# Patient Record
Sex: Female | Born: 1970 | Race: White | Hispanic: No | Marital: Single | Smoking: Current every day smoker
Health system: Southern US, Community
[De-identification: ages and names within clinical notes are randomized; demographics above are authoritative.]

## PROBLEM LIST (undated history)

## (undated) DIAGNOSIS — K7031 Alcoholic cirrhosis of liver with ascites: Secondary | ICD-10-CM

## (undated) DIAGNOSIS — F1010 Alcohol abuse, uncomplicated: Secondary | ICD-10-CM

## (undated) DIAGNOSIS — I8511 Secondary esophageal varices with bleeding: Secondary | ICD-10-CM

## (undated) DIAGNOSIS — I10 Essential (primary) hypertension: Secondary | ICD-10-CM

## (undated) DIAGNOSIS — K861 Other chronic pancreatitis: Secondary | ICD-10-CM

---

## 2008-09-03 ENCOUNTER — Emergency Department (HOSPITAL_COMMUNITY): Admission: EM | Admit: 2008-09-03 | Discharge: 2008-09-04 | Payer: Self-pay | Admitting: Emergency Medicine

## 2008-09-15 ENCOUNTER — Emergency Department (HOSPITAL_COMMUNITY): Admission: EM | Admit: 2008-09-15 | Discharge: 2008-09-16 | Payer: Self-pay | Admitting: Emergency Medicine

## 2008-09-24 ENCOUNTER — Emergency Department (HOSPITAL_COMMUNITY): Admission: EM | Admit: 2008-09-24 | Discharge: 2008-09-24 | Payer: Self-pay | Admitting: Emergency Medicine

## 2017-02-15 ENCOUNTER — Encounter (HOSPITAL_COMMUNITY): Payer: Self-pay | Admitting: Internal Medicine

## 2017-02-15 ENCOUNTER — Emergency Department (HOSPITAL_COMMUNITY): Payer: Medicaid Other

## 2017-02-15 ENCOUNTER — Other Ambulatory Visit: Payer: Self-pay

## 2017-02-15 ENCOUNTER — Inpatient Hospital Stay (HOSPITAL_COMMUNITY): Payer: Medicaid Other

## 2017-02-15 ENCOUNTER — Inpatient Hospital Stay (HOSPITAL_COMMUNITY)
Admission: EM | Admit: 2017-02-15 | Discharge: 2017-02-26 | DRG: 981 | Disposition: A | Discharge status: Hospice | Payer: Medicaid Other | Attending: Family Medicine | Admitting: Family Medicine

## 2017-02-15 DIAGNOSIS — K3189 Other diseases of stomach and duodenum: Secondary | ICD-10-CM | POA: Diagnosis present

## 2017-02-15 DIAGNOSIS — S2231XA Fracture of one rib, right side, initial encounter for closed fracture: Secondary | ICD-10-CM | POA: Diagnosis present

## 2017-02-15 DIAGNOSIS — K7040 Alcoholic hepatic failure without coma: Secondary | ICD-10-CM | POA: Diagnosis present

## 2017-02-15 DIAGNOSIS — F329 Major depressive disorder, single episode, unspecified: Secondary | ICD-10-CM | POA: Diagnosis present

## 2017-02-15 DIAGNOSIS — F1010 Alcohol abuse, uncomplicated: Secondary | ICD-10-CM | POA: Diagnosis present

## 2017-02-15 DIAGNOSIS — R791 Abnormal coagulation profile: Secondary | ICD-10-CM | POA: Diagnosis present

## 2017-02-15 DIAGNOSIS — D638 Anemia in other chronic diseases classified elsewhere: Secondary | ICD-10-CM

## 2017-02-15 DIAGNOSIS — N183 Chronic kidney disease, stage 3 (moderate): Secondary | ICD-10-CM | POA: Diagnosis present

## 2017-02-15 DIAGNOSIS — E8809 Other disorders of plasma-protein metabolism, not elsewhere classified: Secondary | ICD-10-CM

## 2017-02-15 DIAGNOSIS — Z09 Encounter for follow-up examination after completed treatment for conditions other than malignant neoplasm: Secondary | ICD-10-CM

## 2017-02-15 DIAGNOSIS — S72001A Fracture of unspecified part of neck of right femur, initial encounter for closed fracture: Secondary | ICD-10-CM

## 2017-02-15 DIAGNOSIS — K7031 Alcoholic cirrhosis of liver with ascites: Principal | ICD-10-CM

## 2017-02-15 DIAGNOSIS — W19XXXA Unspecified fall, initial encounter: Secondary | ICD-10-CM

## 2017-02-15 DIAGNOSIS — R42 Dizziness and giddiness: Secondary | ICD-10-CM

## 2017-02-15 DIAGNOSIS — Z79899 Other long term (current) drug therapy: Secondary | ICD-10-CM

## 2017-02-15 DIAGNOSIS — I10 Essential (primary) hypertension: Secondary | ICD-10-CM

## 2017-02-15 DIAGNOSIS — I959 Hypotension, unspecified: Secondary | ICD-10-CM | POA: Diagnosis present

## 2017-02-15 DIAGNOSIS — Z638 Other specified problems related to primary support group: Secondary | ICD-10-CM

## 2017-02-15 DIAGNOSIS — E876 Hypokalemia: Secondary | ICD-10-CM | POA: Diagnosis present

## 2017-02-15 DIAGNOSIS — W06XXXA Fall from bed, initial encounter: Secondary | ICD-10-CM | POA: Diagnosis present

## 2017-02-15 DIAGNOSIS — K7011 Alcoholic hepatitis with ascites: Secondary | ICD-10-CM | POA: Diagnosis present

## 2017-02-15 DIAGNOSIS — I129 Hypertensive chronic kidney disease with stage 1 through stage 4 chronic kidney disease, or unspecified chronic kidney disease: Secondary | ICD-10-CM | POA: Diagnosis present

## 2017-02-15 DIAGNOSIS — Z66 Do not resuscitate: Secondary | ICD-10-CM | POA: Diagnosis not present

## 2017-02-15 DIAGNOSIS — Z8711 Personal history of peptic ulcer disease: Secondary | ICD-10-CM

## 2017-02-15 DIAGNOSIS — R197 Diarrhea, unspecified: Secondary | ICD-10-CM

## 2017-02-15 DIAGNOSIS — F17200 Nicotine dependence, unspecified, uncomplicated: Secondary | ICD-10-CM | POA: Diagnosis present

## 2017-02-15 DIAGNOSIS — E871 Hypo-osmolality and hyponatremia: Secondary | ICD-10-CM

## 2017-02-15 DIAGNOSIS — Z7189 Other specified counseling: Secondary | ICD-10-CM

## 2017-02-15 DIAGNOSIS — R55 Syncope and collapse: Secondary | ICD-10-CM | POA: Diagnosis present

## 2017-02-15 DIAGNOSIS — D689 Coagulation defect, unspecified: Secondary | ICD-10-CM

## 2017-02-15 DIAGNOSIS — N179 Acute kidney failure, unspecified: Secondary | ICD-10-CM

## 2017-02-15 DIAGNOSIS — I8511 Secondary esophageal varices with bleeding: Secondary | ICD-10-CM | POA: Diagnosis present

## 2017-02-15 DIAGNOSIS — K766 Portal hypertension: Secondary | ICD-10-CM | POA: Diagnosis present

## 2017-02-15 DIAGNOSIS — J449 Chronic obstructive pulmonary disease, unspecified: Secondary | ICD-10-CM | POA: Diagnosis present

## 2017-02-15 DIAGNOSIS — S72141A Displaced intertrochanteric fracture of right femur, initial encounter for closed fracture: Secondary | ICD-10-CM | POA: Diagnosis present

## 2017-02-15 DIAGNOSIS — K861 Other chronic pancreatitis: Secondary | ICD-10-CM | POA: Diagnosis present

## 2017-02-15 DIAGNOSIS — K709 Alcoholic liver disease, unspecified: Secondary | ICD-10-CM

## 2017-02-15 DIAGNOSIS — R52 Pain, unspecified: Secondary | ICD-10-CM

## 2017-02-15 DIAGNOSIS — R112 Nausea with vomiting, unspecified: Secondary | ICD-10-CM

## 2017-02-15 DIAGNOSIS — B182 Chronic viral hepatitis C: Secondary | ICD-10-CM | POA: Diagnosis present

## 2017-02-15 DIAGNOSIS — K860 Alcohol-induced chronic pancreatitis: Secondary | ICD-10-CM

## 2017-02-15 DIAGNOSIS — B370 Candidal stomatitis: Secondary | ICD-10-CM | POA: Diagnosis present

## 2017-02-15 DIAGNOSIS — R109 Unspecified abdominal pain: Secondary | ICD-10-CM

## 2017-02-15 DIAGNOSIS — Z880 Allergy status to penicillin: Secondary | ICD-10-CM

## 2017-02-15 DIAGNOSIS — K7290 Hepatic failure, unspecified without coma: Secondary | ICD-10-CM

## 2017-02-15 DIAGNOSIS — Z9049 Acquired absence of other specified parts of digestive tract: Secondary | ICD-10-CM

## 2017-02-15 DIAGNOSIS — D696 Thrombocytopenia, unspecified: Secondary | ICD-10-CM

## 2017-02-15 DIAGNOSIS — Z515 Encounter for palliative care: Secondary | ICD-10-CM

## 2017-02-15 DIAGNOSIS — B1710 Acute hepatitis C without hepatic coma: Secondary | ICD-10-CM

## 2017-02-15 DIAGNOSIS — D62 Acute posthemorrhagic anemia: Secondary | ICD-10-CM | POA: Diagnosis present

## 2017-02-15 DIAGNOSIS — K922 Gastrointestinal hemorrhage, unspecified: Secondary | ICD-10-CM | POA: Diagnosis present

## 2017-02-15 DIAGNOSIS — Z419 Encounter for procedure for purposes other than remedying health state, unspecified: Secondary | ICD-10-CM

## 2017-02-15 DIAGNOSIS — D649 Anemia, unspecified: Secondary | ICD-10-CM

## 2017-02-15 DIAGNOSIS — Z7141 Alcohol abuse counseling and surveillance of alcoholic: Secondary | ICD-10-CM

## 2017-02-15 HISTORY — DX: Secondary esophageal varices with bleeding: I85.11

## 2017-02-15 HISTORY — DX: Essential (primary) hypertension: I10

## 2017-02-15 HISTORY — DX: Alcoholic cirrhosis of liver with ascites: K70.31

## 2017-02-15 HISTORY — DX: Alcohol abuse, uncomplicated: F10.10

## 2017-02-15 HISTORY — DX: Other chronic pancreatitis: K86.1

## 2017-02-15 LAB — CBC WITH DIFFERENTIAL/PLATELET
BASOS ABS: 0 10*3/uL (ref 0.0–0.1)
Basophils Relative: 0 %
EOS PCT: 0 %
Eosinophils Absolute: 0 10*3/uL (ref 0.0–0.7)
HEMATOCRIT: 9.8 % — AB (ref 36.0–46.0)
HEMOGLOBIN: 3.2 g/dL — AB (ref 12.0–15.0)
LYMPHS PCT: 20 %
Lymphs Abs: 0.9 10*3/uL (ref 0.7–4.0)
MCH: 29.9 pg (ref 26.0–34.0)
MCHC: 32.7 g/dL (ref 30.0–36.0)
MCV: 91.6 fL (ref 78.0–100.0)
Monocytes Absolute: 1 10*3/uL (ref 0.1–1.0)
Monocytes Relative: 22 %
NEUTROS PCT: 58 %
Neutro Abs: 2.8 10*3/uL (ref 1.7–7.7)
Platelets: 97 10*3/uL — ABNORMAL LOW (ref 150–400)
RBC: 1.07 MIL/uL — AB (ref 3.87–5.11)
RDW: 22 % — ABNORMAL HIGH (ref 11.5–15.5)
WBC: 4.7 10*3/uL (ref 4.0–10.5)

## 2017-02-15 LAB — URINALYSIS, ROUTINE W REFLEX MICROSCOPIC
Bilirubin Urine: NEGATIVE
GLUCOSE, UA: NEGATIVE mg/dL
Hgb urine dipstick: NEGATIVE
Ketones, ur: NEGATIVE mg/dL
NITRITE: NEGATIVE
PH: 5 (ref 5.0–8.0)
Protein, ur: NEGATIVE mg/dL
SPECIFIC GRAVITY, URINE: 1.013 (ref 1.005–1.030)

## 2017-02-15 LAB — COMPREHENSIVE METABOLIC PANEL
ALBUMIN: 1.7 g/dL — AB (ref 3.5–5.0)
ALT: 360 U/L — ABNORMAL HIGH (ref 14–54)
ANION GAP: 10 (ref 5–15)
AST: 1069 U/L — AB (ref 15–41)
Alkaline Phosphatase: 86 U/L (ref 38–126)
BILIRUBIN TOTAL: 4.1 mg/dL — AB (ref 0.3–1.2)
BUN: 61 mg/dL — AB (ref 6–20)
CO2: 23 mmol/L (ref 22–32)
Calcium: 6.8 mg/dL — ABNORMAL LOW (ref 8.9–10.3)
Chloride: 94 mmol/L — ABNORMAL LOW (ref 101–111)
Creatinine, Ser: 1.67 mg/dL — ABNORMAL HIGH (ref 0.44–1.00)
GFR calc Af Amer: 41 mL/min — ABNORMAL LOW (ref >60)
GFR calc non Af Amer: 36 mL/min — ABNORMAL LOW (ref >60)
GLUCOSE: 126 mg/dL — AB (ref 65–99)
Potassium: 2.9 mmol/L — ABNORMAL LOW (ref 3.5–5.1)
SODIUM: 127 mmol/L — AB (ref 135–145)
TOTAL PROTEIN: 5.3 g/dL — AB (ref 6.5–8.1)

## 2017-02-15 LAB — RAPID URINE DRUG SCREEN, HOSP PERFORMED
Amphetamines: NOT DETECTED
Barbiturates: POSITIVE — AB
Benzodiazepines: POSITIVE — AB
Cocaine: NOT DETECTED
OPIATES: NOT DETECTED
TETRAHYDROCANNABINOL: POSITIVE — AB

## 2017-02-15 LAB — APTT: APTT: 44 s — AB (ref 24–36)

## 2017-02-15 LAB — AMMONIA: AMMONIA: 26 umol/L (ref 9–35)

## 2017-02-15 LAB — HEMOGLOBIN AND HEMATOCRIT, BLOOD
HCT: 15 % — ABNORMAL LOW (ref 36.0–46.0)
HCT: 17.1 % — ABNORMAL LOW (ref 36.0–46.0)
Hemoglobin: 4.9 g/dL — CL (ref 12.0–15.0)
Hemoglobin: 5.9 g/dL — CL (ref 12.0–15.0)

## 2017-02-15 LAB — I-STAT BETA HCG BLOOD, ED (MC, WL, AP ONLY): I-stat hCG, quantitative: <5 m[IU]/mL (ref <5)

## 2017-02-15 LAB — BASIC METABOLIC PANEL WITH GFR
BUN: 48 mg/dL — ABNORMAL HIGH (ref 6–20)
CO2: 19 mmol/L — ABNORMAL LOW (ref 22–32)
Chloride: 110 mmol/L (ref 101–111)
Potassium: 2.8 mmol/L — ABNORMAL LOW (ref 3.5–5.1)

## 2017-02-15 LAB — BASIC METABOLIC PANEL
Anion gap: 3 — ABNORMAL LOW (ref 5–15)
Calcium: 5.6 mg/dL — CL (ref 8.9–10.3)
Creatinine, Ser: 1.21 mg/dL — ABNORMAL HIGH (ref 0.44–1.00)
GFR calc Af Amer: >60 mL/min (ref >60)
GFR calc non Af Amer: 52 mL/min — ABNORMAL LOW (ref >60)
Glucose, Bld: 86 mg/dL (ref 65–99)
Sodium: 132 mmol/L — ABNORMAL LOW (ref 135–145)

## 2017-02-15 LAB — LIPASE, BLOOD: Lipase: 73 U/L — ABNORMAL HIGH (ref 11–51)

## 2017-02-15 LAB — PROTIME-INR
INR: 2.65
Prothrombin Time: 28.1 seconds — ABNORMAL HIGH (ref 11.4–15.2)

## 2017-02-15 LAB — ETHANOL: Alcohol, Ethyl (B): <10 mg/dL (ref <10)

## 2017-02-15 LAB — I-STAT TROPONIN, ED: Troponin i, poc: 0 ng/mL (ref 0.00–0.08)

## 2017-02-15 LAB — I-STAT CG4 LACTIC ACID, ED: Lactic Acid, Venous: 1.34 mmol/L (ref 0.5–1.9)

## 2017-02-15 LAB — PREPARE RBC (CROSSMATCH)

## 2017-02-15 LAB — ABO/RH: ABO/RH(D): AB POS

## 2017-02-15 MED ORDER — ACETAMINOPHEN 325 MG PO TABS: 650.0000 mg | ORAL_TABLET | Freq: Four times a day (QID) | ORAL | Status: DC | PRN

## 2017-02-15 MED ORDER — PREDNISOLONE SODIUM PHOSPHATE 15 MG/5ML PO SOLN
60.0000 mg | Freq: Every day | ORAL | Status: DC
  Administered 2017-02-15: 60 mg via ORAL
  Filled 2017-02-15: qty 4
  Filled 2017-02-15: qty 20

## 2017-02-15 MED ORDER — SODIUM CHLORIDE 0.9 % IV SOLN
INTRAVENOUS | Status: DC
  Administered 2017-02-15: 125 mL/h via INTRAVENOUS
  Administered 2017-02-15: 07:00:00 via INTRAVENOUS

## 2017-02-15 MED ORDER — ADULT MULTIVITAMIN W/MINERALS CH
1.0000 | ORAL_TABLET | Freq: Every day | ORAL | Status: DC
  Administered 2017-02-15 – 2017-02-26 (×10): 1 via ORAL
  Filled 2017-02-15 (×10): qty 1

## 2017-02-15 MED ORDER — PANTOPRAZOLE SODIUM 40 MG IV SOLR
8.0000 mg/h | INTRAVENOUS | Status: DC
  Administered 2017-02-15 – 2017-02-16 (×2): 8 mg/h via INTRAVENOUS
  Filled 2017-02-15 (×4): qty 80

## 2017-02-15 MED ORDER — LORAZEPAM 1 MG PO TABS
1.0000 mg | ORAL_TABLET | Freq: Four times a day (QID) | ORAL | Status: AC | PRN
  Administered 2017-02-17: 1 mg via ORAL
  Filled 2017-02-15: qty 1

## 2017-02-15 MED ORDER — POTASSIUM CHLORIDE 10 MEQ/100ML IV SOLN
10.0000 meq | INTRAVENOUS | Status: AC
  Administered 2017-02-15 (×2): 10 meq via INTRAVENOUS
  Filled 2017-02-15: qty 100

## 2017-02-15 MED ORDER — SODIUM CHLORIDE 0.9 % IV SOLN
1000.0000 mL | INTRAVENOUS | Status: DC
  Administered 2017-02-15 (×2): 1000 mL via INTRAVENOUS

## 2017-02-15 MED ORDER — VITAMIN B-1 100 MG PO TABS
100.0000 mg | ORAL_TABLET | Freq: Every day | ORAL | Status: DC
  Administered 2017-02-15 – 2017-02-26 (×10): 100 mg via ORAL
  Filled 2017-02-15 (×10): qty 1

## 2017-02-15 MED ORDER — LORAZEPAM 2 MG/ML IJ SOLN
1.0000 mg | Freq: Four times a day (QID) | INTRAMUSCULAR | Status: AC | PRN
  Administered 2017-02-15 – 2017-02-18 (×4): 1 mg via INTRAVENOUS
  Filled 2017-02-15 (×4): qty 1

## 2017-02-15 MED ORDER — SODIUM CHLORIDE 0.9% FLUSH
3.0000 mL | Freq: Two times a day (BID) | INTRAVENOUS | Status: DC
  Administered 2017-02-15 – 2017-02-24 (×15): 3 mL via INTRAVENOUS

## 2017-02-15 MED ORDER — ACETAMINOPHEN 650 MG RE SUPP: 650.0000 mg | Freq: Four times a day (QID) | RECTAL | Status: DC | PRN

## 2017-02-15 MED ORDER — POTASSIUM CHLORIDE 10 MEQ/100ML IV SOLN
10.0000 meq | INTRAVENOUS | Status: DC
  Administered 2017-02-15 (×2): 10 meq via INTRAVENOUS
  Filled 2017-02-15 (×2): qty 100

## 2017-02-15 MED ORDER — VITAMIN K1 10 MG/ML IJ SOLN
10.0000 mg | Freq: Once | INTRAVENOUS | Status: AC
  Administered 2017-02-15: 10 mg via INTRAVENOUS
  Filled 2017-02-15: qty 1

## 2017-02-15 MED ORDER — SODIUM CHLORIDE 0.9 % IV SOLN
2.0000 g | Freq: Two times a day (BID) | INTRAVENOUS | Status: DC
  Administered 2017-02-15 (×2): 2 g via INTRAVENOUS
  Filled 2017-02-15 (×3): qty 2

## 2017-02-15 MED ORDER — THIAMINE HCL 100 MG/ML IJ SOLN
100.0000 mg | Freq: Every day | INTRAMUSCULAR | Status: DC
  Administered 2017-02-16 – 2017-02-18 (×2): 100 mg via INTRAVENOUS
  Filled 2017-02-15 (×2): qty 2

## 2017-02-15 MED ORDER — SODIUM CHLORIDE 0.9 % IV BOLUS (SEPSIS)
1000.0000 mL | Freq: Once | INTRAVENOUS | Status: AC
  Administered 2017-02-15: 1000 mL via INTRAVENOUS

## 2017-02-15 MED ORDER — SODIUM CHLORIDE 0.9 % IV SOLN
INTRAVENOUS | Status: AC
  Administered 2017-02-16 – 2017-02-17 (×5): via INTRAVENOUS

## 2017-02-15 MED ORDER — SODIUM CHLORIDE 0.9 % IV SOLN
80.0000 mg | Freq: Once | INTRAVENOUS | Status: AC
  Administered 2017-02-15: 09:00:00 80 mg via INTRAVENOUS
  Filled 2017-02-15: qty 80

## 2017-02-15 MED ORDER — ONDANSETRON HCL 4 MG/2ML IJ SOLN
4.0000 mg | Freq: Once | INTRAMUSCULAR | Status: AC
  Administered 2017-02-15: 4 mg via INTRAVENOUS

## 2017-02-15 MED ORDER — FENTANYL CITRATE (PF) 100 MCG/2ML IJ SOLN
25.0000 ug | INTRAMUSCULAR | Status: DC | PRN
  Administered 2017-02-15 – 2017-02-20 (×15): 50 ug via INTRAVENOUS
  Filled 2017-02-15 (×18): qty 2

## 2017-02-15 MED ORDER — SODIUM CHLORIDE 0.9 % IV SOLN
50.0000 ug/h | INTRAVENOUS | Status: DC
  Administered 2017-02-15 – 2017-02-21 (×12): 50 ug/h via INTRAVENOUS
  Filled 2017-02-15 (×22): qty 1

## 2017-02-15 MED ORDER — FENTANYL CITRATE (PF) 100 MCG/2ML IJ SOLN
25.0000 ug | INTRAMUSCULAR | Status: AC | PRN
  Administered 2017-02-15 (×2): 25 ug via INTRAVENOUS
  Filled 2017-02-15 (×2): qty 2

## 2017-02-15 MED ORDER — ALBUTEROL SULFATE HFA 108 (90 BASE) MCG/ACT IN AERS
1.0000 | INHALATION_SPRAY | Freq: Four times a day (QID) | RESPIRATORY_TRACT | Status: DC | PRN
  Filled 2017-02-15: qty 6.7

## 2017-02-15 MED ORDER — ONDANSETRON HCL 4 MG/2ML IJ SOLN
4.0000 mg | Freq: Four times a day (QID) | INTRAMUSCULAR | Status: DC | PRN
  Administered 2017-02-15 – 2017-02-19 (×2): 4 mg via INTRAVENOUS
  Filled 2017-02-15 (×2): qty 2

## 2017-02-15 MED ORDER — FOLIC ACID 1 MG PO TABS
1.0000 mg | ORAL_TABLET | Freq: Every day | ORAL | Status: DC
  Administered 2017-02-15 – 2017-02-26 (×10): 1 mg via ORAL
  Filled 2017-02-15 (×10): qty 1

## 2017-02-15 MED ORDER — FENTANYL CITRATE (PF) 100 MCG/2ML IJ SOLN
50.0000 ug | Freq: Once | INTRAMUSCULAR | Status: AC
  Administered 2017-02-15: 50 ug via INTRAVENOUS
  Filled 2017-02-15: qty 2

## 2017-02-15 MED ORDER — POTASSIUM CHLORIDE CRYS ER 20 MEQ PO TBCR
40.0000 meq | EXTENDED_RELEASE_TABLET | Freq: Once | ORAL | Status: AC
  Administered 2017-02-15: 40 meq via ORAL
  Filled 2017-02-15: qty 2

## 2017-02-15 MED ORDER — PANTOPRAZOLE SODIUM 40 MG IV SOLR: 40.0000 mg | Freq: Two times a day (BID) | INTRAVENOUS | Status: DC

## 2017-02-15 MED ORDER — SODIUM CHLORIDE 0.9 % IV SOLN
10.0000 mL/h | Freq: Once | INTRAVENOUS | Status: AC
  Administered 2017-02-15: 10 mL/h via INTRAVENOUS

## 2017-02-15 MED ORDER — ONDANSETRON HCL 4 MG PO TABS
4.0000 mg | ORAL_TABLET | Freq: Four times a day (QID) | ORAL | Status: DC | PRN
  Administered 2017-02-21 – 2017-02-23 (×2): 4 mg via ORAL
  Filled 2017-02-15 (×2): qty 1

## 2017-02-15 NOTE — Consult Note (Signed)
ORTHOPAEDIC CONSULTATION  REQUESTING PHYSICIAN: Purohit, Konrad Dolores, MD  Chief Complaint: Right hip pain  Assessment / Plan: Principal Problem:   Acute upper GI bleeding Active Problems:   Alcoholic cirrhosis of liver with ascites (HCC)   Secondary esophageal varices with bleeding (HCC)   AKI (acute kidney injury) ([place])   Alcohol abuse   Acute on chronic alcoholic liver disease (HCC)   Acute upper GI bleed  Closed right intertrochanteric femur fracture  In the setting of acute GI bleed.  IM nail right femur would be surgical recommendation.  However, acute medical problems prohibitive.  Nonweightbearing RLE  Will follow along and discuss with weekend orthopedic call team-doubtful that patient will be safe for surgery tomorrow.  Smoking cessation  Pain control  The risks benefits and alternatives of surgery were discussed with the patient.  The patient verbalizes understanding and also understands that acute medical problems are prohibitive.     HPI: Debbie Barker is a 47 y.o. female with extensive medical and social history who complains of fatigue and weakness leading to a fall.  In the emergency department images of her right hip revealed a mildly displaced intertrochanteric femur fracture.  Orthopedics was consulted for evaluation.  Today she complains of persistent pain especially with movement of right lower extremity.  Last meal 2 days ago- she did not feel like eating.  Previously ambulatory without aid.  The patient was living with her boyfriend.  She is a smoker.  She denies specific medicine allergy.  Her "brothers may have had a PCN allergy, but [she] does not remember what it might be."  Past Medical History:  Diagnosis Date  . Alcohol abuse 02/15/2017  . Alcoholic cirrhosis of liver with ascites ([place]) 02/15/2017  . Chronic pancreatitis ([place])   . Secondary esophageal varices with bleeding ([place]) 02/15/2017   History reviewed. No pertinent surgical  history. Social History   Socioeconomic History  . Marital status: Single    Spouse name: None  . Number of children: None  . Years of education: None  . Highest education level: None  Social Needs  . Financial resource strain: None  . Food insecurity - worry: None  . Food insecurity - inability: None  . Transportation needs - medical: None  . Transportation needs - non-medical: None  Occupational History  . None  Tobacco Use  . Smoking status: None  Substance and Sexual Activity  . Alcohol use: Yes  . Drug use: None  . Sexual activity: Not Currently  Other Topics Concern  . None  Social History Narrative  . None   History reviewed. No pertinent family history. Allergies  Allergen Reactions  . Penicillins     UNKNOWN  Has patient had a PCN reaction causing immediate rash, facial/tongue/throat swelling, SOB or lightheadedness with hypotension: Unknown Has patient had a PCN reaction causing severe rash involving mucus membranes or skin necrosis: Unknown Has patient had a PCN reaction that required hospitalization: Unknown Has patient had a PCN reaction occurring within the last 10 years: No If all of the above answers are "NO", then may proceed with Cephalosporin use.    Prior to Admission medications   Medication Sig Start Date End Date Taking? Authorizing Provider  albuterol (PROVENTIL HFA;VENTOLIN HFA) 108 (90 Base) MCG/ACT inhaler Inhale 1-2 puffs into the lungs every 6 (six) hours as needed for wheezing or shortness of breath.   Yes [provider]  Carvedilol (COREG PO) Take by mouth.   Yes [provider]  FLUoxetine HCl (PROZAC PO) Take by mouth.   Yes [provider]  Furosemide (LASIX PO) Take by mouth.   Yes [provider]  GABAPENTIN PO Take by mouth.   Yes [provider]  OMEPRAZOLE PO Take by mouth.   Yes [provider]   Dg Chest 2 View  Result Date: 02/15/2017 CLINICAL DATA:  Chest pain.  Fall.  EXAM: CHEST  2 VIEW COMPARISON:  02/04/2017 FINDINGS: The heart size and mediastinal contours are within normal limits. Both lungs are clear. There is an acute fracture involving the anterior aspect of the right tenth rib. IMPRESSION: 1. Right tenth rib fracture. 2. No active cardiopulmonary abnormalities. Electronically Signed   By: Kerby Moors M.D.   On: 02/15/2017 08:24   Ct Head Wo Contrast  Result Date: 02/15/2017 CLINICAL DATA:  Fall.  Slipped on floor. EXAM: CT HEAD WITHOUT CONTRAST CT CERVICAL SPINE WITHOUT CONTRAST TECHNIQUE: Multidetector CT imaging of the head and cervical spine was performed following the standard protocol without intravenous contrast. Multiplanar CT image reconstructions of the cervical spine were also generated. COMPARISON:  08/12/2013 FINDINGS: CT HEAD FINDINGS Brain: No evidence of acute infarction, hemorrhage, hydrocephalus, extra-axial collection or mass lesion/mass effect. Vascular: No hyperdense vessel or unexpected calcification. Skull: Normal. Negative for fracture or focal lesion. Sinuses/Orbits: The mastoid air cells are clear. Partial opacification of right posterior ethmoid air cell. Other: None CT CERVICAL SPINE FINDINGS Alignment: Normal. Skull base and vertebrae: No acute fracture. No primary bone lesion or focal pathologic process. Soft tissues and spinal canal: No prevertebral fluid or swelling. No visible canal hematoma. Disc levels:  Unremarkable. Upper chest: Negative. Other: None IMPRESSION: 1. Normal brain. 2. No evidence for cervical spine fracture or dislocation. Electronically Signed   By: Kerby Moors M.D.   On: 02/15/2017 07:37   Ct Cervical Spine Wo Contrast  Result Date: 02/15/2017 CLINICAL DATA:  Fall.  Slipped on floor. EXAM: CT HEAD WITHOUT CONTRAST CT CERVICAL SPINE WITHOUT CONTRAST TECHNIQUE: Multidetector CT imaging of the head and cervical spine was performed following the standard protocol without intravenous contrast. Multiplanar CT  image reconstructions of the cervical spine were also generated. COMPARISON:  08/12/2013 FINDINGS: CT HEAD FINDINGS Brain: No evidence of acute infarction, hemorrhage, hydrocephalus, extra-axial collection or mass lesion/mass effect. Vascular: No hyperdense vessel or unexpected calcification. Skull: Normal. Negative for fracture or focal lesion. Sinuses/Orbits: The mastoid air cells are clear. Partial opacification of right posterior ethmoid air cell. Other: None CT CERVICAL SPINE FINDINGS Alignment: Normal. Skull base and vertebrae: No acute fracture. No primary bone lesion or focal pathologic process. Soft tissues and spinal canal: No prevertebral fluid or swelling. No visible canal hematoma. Disc levels:  Unremarkable. Upper chest: Negative. Other: None IMPRESSION: 1. Normal brain. 2. No evidence for cervical spine fracture or dislocation. Electronically Signed   By: Kerby Moors M.D.   On: 02/15/2017 07:37   Dg Hip Unilat  With Pelvis 2-3 Views Right  Result Date: 02/15/2017 CLINICAL DATA:  Fall out of bed this morning landing on right hip. Right hip pain and deformity. EXAM: DG HIP (WITH OR WITHOUT PELVIS) 2-3V RIGHT COMPARISON:  None. FINDINGS: Displaced mildly comminuted intertrochanteric right hip fracture with mild proximal migration of the femoral shaft. Mild displacement of the lesser trochanter. Minimal apex lateral angulation. Femoral head remains seated. No additional fracture of the bony pelvis. IMPRESSION: Displaced mildly comminuted intertrochanteric right hip fracture. Electronically Signed   By: Jeb Levering M.D.   On:  02/15/2017 04:12    Positive ROS: All other systems have been reviewed and were otherwise negative with the exception of those mentioned in the HPI and as above.  Objective: Labs cbc Recent Labs    02/15/17 0616 02/15/17 1618  WBC 4.7  --   HGB 3.2* 4.9*  HCT 9.8* 15.0*  PLT 97*  --     Labs inflam No results for input(s): CRP in the last 72  hours.  Invalid input(s): ESR  Labs coag Recent Labs    02/15/17 0616  INR 2.65    Recent Labs    02/15/17 0616 02/15/17 1618  NA 127* 132*  K 2.9* 2.8*  CL 94* 110  CO2 23 19*  GLUCOSE 126* 86  BUN 61* 48*  CREATININE 1.67* 1.21*  CALCIUM 6.8* 5.6*    Physical Exam: Vitals:   02/15/17 1600 02/15/17 1630  BP: 90/60 (!) 96/42  Pulse: 70 64  Resp: 15 14  Temp:    SpO2: 100% 98%   General: Alert, uncomfortable with movement.  No acute distress Mental status: Alert and Oriented x3 Neurologic: Speech Clear and organized, no gross focal findings or movement disorder appreciated. Respiratory: No cyanosis, no use of accessory musculature. Cardiovascular: No pedal edema GI: Abdomen is soft, non-distended. Skin: Warm and dry.  No lesions in the area of chief complaint  Extremities: Warm and well perfused w/o edema Psychiatric: Patient is competent for consent with normal mood and affect  MUSCULOSKELETAL:  Pain with movement of right lower extremity.  Dorsiflexion plantar flexion EHL/FHL intact.  Sensation intact distally. Other extremities are atraumatic with painless ROM and NVI.   Prudencio Burly III PA-C 02/15/2017 6:23 PM

## 2017-02-15 NOTE — ED Notes (Signed)
Bed: WA18 Expected date:  Expected time:  Means of arrival:  Comments: EMS fall/hip pain 

## 2017-02-15 NOTE — H&P (Addendum)
History and Physical    Debbie Barker EAV:[id number] DOB: [birth year] DOA: 02/15/2017  PCP: Lavinia Sharps, NP   Patient coming from: Home    Chief Complaint: Falls, upper GI bleeding  HPI: Debbie Barker is a 47 y.o. female with medical history significant of alcoholic cirrhosis complicated by chronic pancreatitis, esophageal varices with prior GI bleeds, ascites, active alcohol abuse, depression who comes in with fall and severe leg pain in the setting of presyncopal episode after several days of hematemesis.  She has an extremely poor historian.  She reports that she was recently discharged from an inpatient psychiatric hospitalization at [facility] and went home.  She will not go into the specifics of her hospitalization and there is nothing in the electronic chart but she reports she had a "breakdown" due to multiple problems with her family.  After discharge she reports that she began to feel more and more fatigued and weaker.  She also began to have presyncopal symptoms including orthostasis when she stood up.  After much discussion she finally admitted to the fact that she had been having large volume hematemesis for the past 3 days.  She also reported that she been having dark tarry stools for the last week.  She reports this is happened before.  She reports her last drink was approximately 1.5 weeks ago just prior to her admission at [place].  However on further interrogation she reports that she did have may be "half a beer" after discharge several days ago.  She insisted this was her last dose of alcohol.  She does report a history of delirium tremens, and requiring inpatient hospitalization for alcohol withdrawal but denies any seizures. On discussion of her history of cirrhosis she does report decades of heavy daily drinking.  She reports she was told she was diagnosed with cirrhosis approximately 5-6 years ago.  She has had episodes of both hematemesis, hematochezia, and melena that has  required inpatient hospitalization and treatment.  She reports that she does have a history of esophageal varices and has been on a beta-blocker for this.  She denies any abdominal pain, chest pain, syncope, lower extremity edema, cough, fever, rash.  ED Course: In the ED her vitals were notable only for a blood pressure of 80-90 systolic.  X-ray showed mildly comminuted intertrochanteric right hip fracture.  This was reportedly chronic for her.  She was not tachycardic.  Her labs are notable for hemoglobin of 3.2 and platelets of 97.  Her conference of metabolic panel was completely and grossly abnormal.  Her creatinine was 1.67 with elevated BUN to 61, sodium was 127, potassium is 2.9, AST was 1069, ALT was 360, total bilirubin was 4.1, albumin was 1.7.  INR was 2.65.  Her ethanol level was negative.  She was given 2 units packed red blood cells.  Review of Systems: As per HPI otherwise 10 point review of systems negative.    Past Medical History:  Diagnosis Date  . Alcohol abuse 02/15/2017  . Alcoholic cirrhosis of liver with ascites (HCC) 02/15/2017  . Chronic pancreatitis (HCC)   . Secondary esophageal varices with bleeding (HCC) 02/15/2017    History reviewed. No pertinent surgical history.   has no tobacco, alcohol, and drug history on file.  Allergies  Allergen Reactions  . Penicillins Other (See Comments)    Has patient had a PCN reaction causing immediate rash, facial/tongue/throat swelling, SOB or lightheadedness with hypotension: Unknown Has patient had a PCN reaction causing severe rash involving mucus  membranes or skin necrosis: Unknown Has patient had a PCN reaction that required hospitalization: Unknown Has patient had a PCN reaction occurring within the last 10 years: No If all of the above answers are "NO", then may proceed with Cephalosporin use.     History reviewed. No pertinent family history. Unacceptable: Noncontributory, unremarkable, or negative. Acceptable:  Family history reviewed and not pertinent (If you reviewed it)  Prior to Admission medications   Medication Sig Start Date End Date Taking? Authorizing Provider  albuterol (PROVENTIL HFA;VENTOLIN HFA) 108 (90 Base) MCG/ACT inhaler Inhale 1-2 puffs into the lungs every 6 (six) hours as needed for wheezing or shortness of breath.   Yes [provider]  Carvedilol (COREG PO) Take by mouth.   Yes [provider]  FLUoxetine HCl (PROZAC PO) Take by mouth.   Yes [provider]  Furosemide (LASIX PO) Take by mouth.   Yes [provider]  GABAPENTIN PO Take by mouth.   Yes [provider]  OMEPRAZOLE PO Take by mouth.   Yes [provider]    Physical Exam: Vitals:   02/15/17 1000 02/15/17 1030 02/15/17 1100 02/15/17 1130  BP: (!) 87/44 (!) 91/42 (!) 85/38 (!) 84/42  Pulse: (!) 58 66 66 68  Resp: 10 12 11 13   Temp:      TempSrc:      SpO2: 100% 99% 100% 100%    Constitutional: NAD, calm, comfortable Vitals:   02/15/17 1000 02/15/17 1030 02/15/17 1100 02/15/17 1130  BP: (!) 87/44 (!) 91/42 (!) 85/38 (!) 84/42  Pulse: (!) 58 66 66 68  Resp: 10 12 11 13   Temp:      TempSrc:      SpO2: 100% 99% 100% 100%   Eyes: icteric sclera ENMT: Dry mucous membranes, poor dentition Neck: normal, supple Respiratory: clear to auscultation bilaterally, no wheezing, no crackles. Normal respiratory effort. No accessory muscle use.  Cardiovascular: Regular rate and rhythm, no murmurs Abdomen: Soft, shifting dullness, diminished bowel sounds, numerous telangiectasias, no hepatosplenomegaly Musculoskeletal: Trace lower extremity edema Skin: Notable excoriations, jaundiced skin Neurologic: No asterixis, moving all extremities Psychiatric: Slowed, alert and oriented x3, poor insight.    Labs on Admission: I have personally reviewed following labs and imaging studies  CBC: Recent Labs  Lab 02/15/17 0616  WBC 4.7  NEUTROABS 2.8  HGB 3.2*  HCT  9.8*  MCV 91.6  PLT 97*   Basic Metabolic Panel: Recent Labs  Lab 02/15/17 0616  NA 127*  K 2.9*  CL 94*  CO2 23  GLUCOSE 126*  BUN 61*  CREATININE 1.67*  CALCIUM 6.8*   GFR: CrCl cannot be calculated (Unknown ideal weight.). Liver Function Tests: Recent Labs  Lab 02/15/17 0616  AST 1,069*  ALT 360*  ALKPHOS 86  BILITOT 4.1*  PROT 5.3*  ALBUMIN 1.7*   Recent Labs  Lab 02/15/17 0616  LIPASE 73*   Recent Labs  Lab 02/15/17 0644  AMMONIA 26   Coagulation Profile: Recent Labs  Lab 02/15/17 0616  INR 2.65   Cardiac Enzymes: No results for input(s): CKTOTAL, CKMB, CKMBINDEX, TROPONINI in the last 168 hours. BNP (last 3 results) No results for input(s): PROBNP in the last 8760 hours. HbA1C: No results for input(s): HGBA1C in the last 72 hours. CBG: No results for input(s): GLUCAP in the last 168 hours. Lipid Profile: No results for input(s): CHOL, HDL, LDLCALC, TRIG, CHOLHDL, LDLDIRECT in the last 72 hours. Thyroid Function Tests: No results for input(s): TSH, T4TOTAL,  FREET4, T3FREE, THYROIDAB in the last 72 hours. Anemia Panel: No results for input(s): VITAMINB12, FOLATE, FERRITIN, TIBC, IRON, RETICCTPCT in the last 72 hours. Urine analysis: No results found for: COLORURINE, APPEARANCEUR, LABSPEC, PHURINE, GLUCOSEU, HGBUR, BILIRUBINUR, KETONESUR, PROTEINUR, UROBILINOGEN, NITRITE, LEUKOCYTESUR  Radiological Exams on Admission: Dg Chest 2 View  Result Date: 02/15/2017 CLINICAL DATA:  Chest pain.  Fall. EXAM: CHEST  2 VIEW COMPARISON:  02/04/2017 FINDINGS: The heart size and mediastinal contours are within normal limits. Both lungs are clear. There is an acute fracture involving the anterior aspect of the right tenth rib. IMPRESSION: 1. Right tenth rib fracture. 2. No active cardiopulmonary abnormalities. Electronically Signed   By: Signa Kell M.D.   On: 02/15/2017 08:24   Ct Head Wo Contrast  Result Date: 02/15/2017 CLINICAL DATA:  Fall.  Slipped on  floor. EXAM: CT HEAD WITHOUT CONTRAST CT CERVICAL SPINE WITHOUT CONTRAST TECHNIQUE: Multidetector CT imaging of the head and cervical spine was performed following the standard protocol without intravenous contrast. Multiplanar CT image reconstructions of the cervical spine were also generated. COMPARISON:  08/12/2013 FINDINGS: CT HEAD FINDINGS Brain: No evidence of acute infarction, hemorrhage, hydrocephalus, extra-axial collection or mass lesion/mass effect. Vascular: No hyperdense vessel or unexpected calcification. Skull: Normal. Negative for fracture or focal lesion. Sinuses/Orbits: The mastoid air cells are clear. Partial opacification of right posterior ethmoid air cell. Other: None CT CERVICAL SPINE FINDINGS Alignment: Normal. Skull base and vertebrae: No acute fracture. No primary bone lesion or focal pathologic process. Soft tissues and spinal canal: No prevertebral fluid or swelling. No visible canal hematoma. Disc levels:  Unremarkable. Upper chest: Negative. Other: None IMPRESSION: 1. Normal brain. 2. No evidence for cervical spine fracture or dislocation. Electronically Signed   By: Signa Kell M.D.   On: 02/15/2017 07:37   Ct Cervical Spine Wo Contrast  Result Date: 02/15/2017 CLINICAL DATA:  Fall.  Slipped on floor. EXAM: CT HEAD WITHOUT CONTRAST CT CERVICAL SPINE WITHOUT CONTRAST TECHNIQUE: Multidetector CT imaging of the head and cervical spine was performed following the standard protocol without intravenous contrast. Multiplanar CT image reconstructions of the cervical spine were also generated. COMPARISON:  08/12/2013 FINDINGS: CT HEAD FINDINGS Brain: No evidence of acute infarction, hemorrhage, hydrocephalus, extra-axial collection or mass lesion/mass effect. Vascular: No hyperdense vessel or unexpected calcification. Skull: Normal. Negative for fracture or focal lesion. Sinuses/Orbits: The mastoid air cells are clear. Partial opacification of right posterior ethmoid air cell. Other:  None CT CERVICAL SPINE FINDINGS Alignment: Normal. Skull base and vertebrae: No acute fracture. No primary bone lesion or focal pathologic process. Soft tissues and spinal canal: No prevertebral fluid or swelling. No visible canal hematoma. Disc levels:  Unremarkable. Upper chest: Negative. Other: None IMPRESSION: 1. Normal brain. 2. No evidence for cervical spine fracture or dislocation. Electronically Signed   By: Signa Kell M.D.   On: 02/15/2017 07:37   Dg Hip Unilat  With Pelvis 2-3 Views Right  Result Date: 02/15/2017 CLINICAL DATA:  Fall out of bed this morning landing on right hip. Right hip pain and deformity. EXAM: DG HIP (WITH OR WITHOUT PELVIS) 2-3V RIGHT COMPARISON:  None. FINDINGS: Displaced mildly comminuted intertrochanteric right hip fracture with mild proximal migration of the femoral shaft. Mild displacement of the lesser trochanter. Minimal apex lateral angulation. Femoral head remains seated. No additional fracture of the bony pelvis. IMPRESSION: Displaced mildly comminuted intertrochanteric right hip fracture. Electronically Signed   By: Rubye Oaks M.D.   On: 02/15/2017 04:12    EKG:  Independently reviewed. Sinus bradycardia  Assessment/Plan Principal Problem:   Acute upper GI bleeding Active Problems:   Alcoholic cirrhosis of liver with ascites (HCC)   Secondary esophageal varices with bleeding (HCC)   AKI (acute kidney injury) (HCC)   Alcohol abuse   Acute on chronic alcoholic liver disease (HCC)   Acute upper GI bleed   ##) Acute upper GI bleeding: Suspect that based on her history of esophageal varices but the most likely cause is a variceal bleed.  Lower suspicion for peptic ulcer disease, Diel[place]oy  lesion, portal hypertensive gastropathy as the cause.  While she does describe some melena it seems most likely due to the severity of her upper GI bleed.  Was most impressive is that she does describe a large volume hematemesis however she is not tachycardic today  despite her profoundly low hemoglobin suggesting some of this is chronic.  Furthermore she reports compliance with her beta-blocker explaining why she is not more symptomatic -Hemoglobin and hematocrit every 4 hours -Transfuse to hemoglobin of 7 -Protonix bolus and PPI drip - Intravenous octreotide 50 mcg/kg/h - aztreonam 2 g every 12 (patient has undisclosed allergy to penicillins)  ##) Acute on chronic liver failure: No prior liver function exams to compare to.  She does report drinking approximately 2 weeks ago prior to admission to [facility] psychiatry.  It is unclear what is causing acute worsening of her liver disease if indeed this is the case.  Her Maddrey discriminant score is 64.4 suggesting she would benefit from prednisolone. -Prednisolone 1 mg/kg -Right upper quadrant ultrasound with Dopplers -Monitor LFTs and PT/INR daily  ##) Alcohol abuse: Patient reports a history of possibly complicated withdrawals however notes her last drink was well over 2 weeks ago.  At this point it is unclear how reliable patient is regarding her history.  Regardless her ethanol level was 0 on presentation. -PRN seizure protocol -P.o. thiamine and folate  ##) Acute kidney injury: Likely in the setting of dehydration and bleeding. -IV fluids and blood  ##) Right hip fracture: In the setting of fall.  Noted to have mildly comminuted intertrochanteric right hip fracture. -Pain control -Orthopedics following, plan to do surgical repair once patient's acute medical problems have resolved  ##) Chronic pancreatitis: Lipase was only very mildly elevated.  She is complaining of diffuse abdominal pain however this seems quite unlike a flareup of chronic pancreatitis.  She is not on any pancreatic enzyme supplementation for unclear reasons.  ##) Psych: She does not appear to be encephalopathic at this time.  She denies any suicidal or homicidal ideation. -Hold sertraline -We will obtain records from [facility] inpatient psychiatric facility  Fluids: IV fluids Electrolytes: Monitor and supplement Nutrition: N.p.o. pending GI evaluation  Disposition: Pending normalization of bleeding and improved electrolytes, kidney function, liver function  Prophylaxis: SCDs  DO NOT RESUSCITATE, discussed extensively with the patient and she does not want any CPR or intubation at this time.  Delaine Lame MD [facility]  If 7PM-7AM, please contact night-coverage [email or web address] Password [facility]  02/15/2017, 11:42 AM

## 2017-02-15 NOTE — ED Provider Notes (Signed)
[facility]-EMERGENCY DEPT Provider Note   CSN: [id number] Arrival date & time: 02/15/17  0246     History   Chief Complaint Chief Complaint  Patient presents with  . Fall  . Hip Pain  . Hypotension    HPI Debbie Barker is a 47 y.o. female with a self-reported PMHx of cirrhosis, HepC, COPD, chronic pancreatitis, stomach ulcers, and chronically low blood pressure, who presents to the ED with complaints of mechanical fall onto right hip resulting in right hip pain.  Patient states that she just got home from a psych facility in [facility] where she was started on Prozac.  She has been feeling lightheaded and "wobbly" when she walks for the past 3-4 days which she thought was because of starting the Prozac.  This evening she stood up and was feeling lightheaded so she sat down, drank some water, and then got up again to get to the bed and went to sit on her Murphy bed however the bed slid out from under her and she fell onto her right hip on the floor.  This occurred just prior to arrival.  She describes the pain as 10/10 constant throbbing nonradiating right hip pain that worsens with movement and has been unrelieved with fentanyl given in route.  She mentions that for the last 2 days she has had nausea and 10 episodes daily of emesis which appears like coffee grounds.  She has also been having diarrhea, stating that she has had 2 episodes today of melanotic diarrhea.  She also reports that since the fall she has had low back and neck pain.  She mentions that she did hit her head but denies loss of consciousness.  Lastly she mentions that she has chronic unchanged chest pain.  She lists her medical problems as those mentioned above, she denies having any adrenal gland issues or being on chronic steroids.  She reports that her blood pressure is always low, usually in the 90s/50s.  She denies being on any blood thinners.  She has multiple medications that she takes however denies any  recent changes aside from starting Prozac several days ago at the psych facility.  She denies any alcohol use in several days.  Her PCP is Dr. Linard Millers in [facility].  She does not have a GI specialist or orthopedist that she sees.  She denies any recent fevers, chills, worsening or new shortness of breath, abdominal pain, constipation, obstipation, hematochezia, dysuria, hematuria, numbness, tingling, focal weakness, vision changes, syncope, or any other acute complaints at this time.   The history is provided by the patient and medical records. No language interpreter was used.  Fall  Associated symptoms include chest pain (chronic, unchanged). Pertinent negatives include no abdominal pain and no shortness of breath.  Hip Pain  This is a new problem. The current episode started less than 1 hour ago (just PTA). The problem occurs constantly. The problem has not changed since onset.Associated symptoms include chest pain (chronic, unchanged). Pertinent negatives include no abdominal pain and no shortness of breath. Exacerbated by: movement. Nothing relieves the symptoms. Treatments tried: fentanyl. The treatment provided no relief.    No past medical history on file.  There are no active problems to display for this patient.   OB History    No data available       Home Medications    Prior to Admission medications   Not on File    Family History No family history on file.  Social History Social History   Tobacco Use  . Smoking status: Not on file  Substance Use Topics  . Alcohol use: Not on file  . Drug use: Not on file     Allergies   Penicillins   Review of Systems Review of Systems  Constitutional: Negative for chills and fever.  HENT: Positive for facial swelling (hit head).   Eyes: Negative for visual disturbance.  Respiratory: Negative for shortness of breath.   Cardiovascular: Positive for chest pain (chronic, unchanged).  Gastrointestinal: Positive for blood  in stool (melena), diarrhea, nausea and vomiting. Negative for abdominal pain and constipation.  Genitourinary: Negative for dysuria and hematuria.  Musculoskeletal: Positive for arthralgias, back pain, myalgias and neck pain.  Skin: Negative for wound.  Allergic/Immunologic: Positive for immunocompromised state (Hep C).  Neurological: Positive for light-headedness. Negative for syncope, weakness and numbness.  Hematological: Does not bruise/bleed easily.  Psychiatric/Behavioral: Negative for confusion.   All other systems reviewed and are negative for acute change except as noted in the HPI.    Physical Exam Updated Vital Signs BP (!) 90/41   Pulse 67   Temp (!) 97.3 F (36.3 C) (Oral)   Resp 10   SpO2 93%     Physical Exam  Constitutional: She is oriented to person, place, and time. She appears well-developed.  Non-toxic appearance. No distress.  Afebrile, nontoxic, NAD, appears chronically ill, appears much older than stated age. Somewhat drowsy appearing however still A&O x4 and answers questions, just very slowly BP 80-90s/30-40s  HENT:  Head: Normocephalic and atraumatic. Head is without raccoon's eyes, without Battle's sign, without abrasion and without contusion.  Mouth/Throat: Oropharynx is clear and moist. Mucous membranes are dry.  Dry lips [place]/AT, no scalp tenderness or crepitus, no abrasions or contusions, no racoon eyes or battle's sign.   Eyes: Conjunctivae and EOM are normal. Pupils are equal, round, and reactive to light. Right eye exhibits no discharge. Left eye exhibits no discharge.  PERRL, EOMI, no nystagmus  Neck: Muscular tenderness present. No spinous process tenderness present.  Towel wrapped around C-spine, limiting ROM evaluation; no focal midline spinous process TTP, no bony stepoffs or deformities, but with diffuse b/l paraspinous muscle TTP without muscle spasms. No bruising or swelling noted on limited exam.   Cardiovascular: Normal rate, regular rhythm,  normal heart sounds and intact distal pulses. Exam reveals no gallop and no friction rub.  No murmur heard. Pulmonary/Chest: Effort normal and breath sounds normal. No respiratory distress. She has no decreased breath sounds. She has no wheezes. She has no rhonchi. She has no rales.  Abdominal: Soft. Normal appearance and bowel sounds are normal. She exhibits distension and ascites. There is generalized tenderness. There is no rigidity, no rebound, no guarding, no tenderness at McBurney's point and negative Murphy's sign.  Soft, mildly distended with ascites/fluid, +BS throughout, with mild generalized abd TTP, no r/g/r, neg murphy's, neg mcburney's  Musculoskeletal:       Right hip: She exhibits decreased range of motion (due to pain), tenderness, bony tenderness, crepitus and deformity.       Lumbar back: She exhibits decreased range of motion (due to pain), tenderness and bony tenderness. She exhibits no deformity and no spasm.  C-spine as above. Lumbar spine with limited ROM due to pain, with diffuse midline spinous process TTP, no bony stepoffs or deformities, and with diffuse b/l paraspinous muscle TTP without appreciable muscle spasms.  R hip with limited ROM due to pain, with +diffuse joint line TTP,  no bruising or swelling appreciated, +crepitus appreciated to lateral joint line, hip held in flexion and external rotation, slightly shortened. Distal strength symmetric bilaterally (dorsiflexion/plantarflexion), and sensation grossly intact, distal pulses intact, compartments soft. No other areas of focal bony tenderness to remainder of legs bilaterally.    Neurological: She is alert and oriented to person, place, and time. She has normal strength. No sensory deficit. GCS eye subscore is 4. GCS verbal subscore is 5. GCS motor subscore is 6.  A&O x4 however fairly slow to answer questions, but still with GCS 15 just somewhat slow responding.   Skin: Skin is warm, dry and intact. No rash noted.    Jaundiced  Psychiatric: She has a normal mood and affect.  Nursing note and vitals reviewed.    ED Treatments / Results  Labs (all labs ordered are listed, but only abnormal results are displayed) Labs Reviewed  CBC WITH DIFFERENTIAL/PLATELET - Abnormal; Notable for the following components:      Result Value   RBC 1.07 (*)    Hemoglobin 3.2 (*)    HCT 9.8 (*)    RDW 22.0 (*)    Platelets 97 (*)    All other components within normal limits  COMPREHENSIVE METABOLIC PANEL - Abnormal; Notable for the following components:   Sodium 127 (*)    Potassium 2.9 (*)    Chloride 94 (*)    Glucose, Bld 126 (*)    BUN 61 (*)    Creatinine, Ser 1.67 (*)    Calcium 6.8 (*)    Total Protein 5.3 (*)    Albumin 1.7 (*)    AST 1,069 (*)    ALT 360 (*)    Total Bilirubin 4.1 (*)    GFR calc non Af Amer 36 (*)    GFR calc Af Amer 41 (*)    All other components within normal limits  PROTIME-INR - Abnormal; Notable for the following components:   Prothrombin Time 28.1 (*)    All other components within normal limits  APTT - Abnormal; Notable for the following components:   aPTT 44 (*)    All other components within normal limits  LIPASE, BLOOD - Abnormal; Notable for the following components:   Lipase 73 (*)    All other components within normal limits  AMMONIA  ETHANOL  URINALYSIS, ROUTINE W REFLEX MICROSCOPIC  RAPID URINE DRUG SCREEN, HOSP PERFORMED  I-STAT TROPONIN, ED  I-STAT BETA HCG BLOOD, ED ([facility], [facility], [facility] ONLY)  I-STAT CG4 LACTIC ACID, ED  TYPE AND SCREEN  ABO/RH  PREPARE RBC (CROSSMATCH)    EKG  EKG Interpretation  Date/Time:  Thursday February 15 2017 07:01:15 EST Ventricular Rate:  68 PR Interval:    QRS Duration: 115 QT Interval:  522 QTC Calculation: 556 R Axis:   66 Text Interpretation:  Sinus rhythm Ventricular trigeminy Nonspecific intraventricular conduction delay No prior ECG for comparison,  No STEMI Confirmed by Theda Belfast ([id number]) on 02/15/2017 7:38:48  AM       Radiology Dg Chest 2 View  Result Date: 02/15/2017 CLINICAL DATA:  Chest pain.  Fall. EXAM: CHEST  2 VIEW COMPARISON:  02/04/2017 FINDINGS: The heart size and mediastinal contours are within normal limits. Both lungs are clear. There is an acute fracture involving the anterior aspect of the right tenth rib. IMPRESSION: 1. Right tenth rib fracture. 2. No active cardiopulmonary abnormalities. Electronically Signed   By: Signa Kell M.D.   On: 02/15/2017 08:24   Ct Head Wo Contrast  Result Date: 02/15/2017 CLINICAL DATA:  Fall.  Slipped on floor. EXAM: CT HEAD WITHOUT CONTRAST CT CERVICAL SPINE WITHOUT CONTRAST TECHNIQUE: Multidetector CT imaging of the head and cervical spine was performed following the standard protocol without intravenous contrast. Multiplanar CT image reconstructions of the cervical spine were also generated. COMPARISON:  08/12/2013 FINDINGS: CT HEAD FINDINGS Brain: No evidence of acute infarction, hemorrhage, hydrocephalus, extra-axial collection or mass lesion/mass effect. Vascular: No hyperdense vessel or unexpected calcification. Skull: Normal. Negative for fracture or focal lesion. Sinuses/Orbits: The mastoid air cells are clear. Partial opacification of right posterior ethmoid air cell. Other: None CT CERVICAL SPINE FINDINGS Alignment: Normal. Skull base and vertebrae: No acute fracture. No primary bone lesion or focal pathologic process. Soft tissues and spinal canal: No prevertebral fluid or swelling. No visible canal hematoma. Disc levels:  Unremarkable. Upper chest: Negative. Other: None IMPRESSION: 1. Normal brain. 2. No evidence for cervical spine fracture or dislocation. Electronically Signed   By: Signa Kell M.D.   On: 02/15/2017 07:37   Ct Cervical Spine Wo Contrast  Result Date: 02/15/2017 CLINICAL DATA:  Fall.  Slipped on floor. EXAM: CT HEAD WITHOUT CONTRAST CT CERVICAL SPINE WITHOUT CONTRAST TECHNIQUE: Multidetector CT imaging of the head and  cervical spine was performed following the standard protocol without intravenous contrast. Multiplanar CT image reconstructions of the cervical spine were also generated. COMPARISON:  08/12/2013 FINDINGS: CT HEAD FINDINGS Brain: No evidence of acute infarction, hemorrhage, hydrocephalus, extra-axial collection or mass lesion/mass effect. Vascular: No hyperdense vessel or unexpected calcification. Skull: Normal. Negative for fracture or focal lesion. Sinuses/Orbits: The mastoid air cells are clear. Partial opacification of right posterior ethmoid air cell. Other: None CT CERVICAL SPINE FINDINGS Alignment: Normal. Skull base and vertebrae: No acute fracture. No primary bone lesion or focal pathologic process. Soft tissues and spinal canal: No prevertebral fluid or swelling. No visible canal hematoma. Disc levels:  Unremarkable. Upper chest: Negative. Other: None IMPRESSION: 1. Normal brain. 2. No evidence for cervical spine fracture or dislocation. Electronically Signed   By: Signa Kell M.D.   On: 02/15/2017 07:37   Dg Hip Unilat  With Pelvis 2-3 Views Right  Result Date: 02/15/2017 CLINICAL DATA:  Fall out of bed this morning landing on right hip. Right hip pain and deformity. EXAM: DG HIP (WITH OR WITHOUT PELVIS) 2-3V RIGHT COMPARISON:  None. FINDINGS: Displaced mildly comminuted intertrochanteric right hip fracture with mild proximal migration of the femoral shaft. Mild displacement of the lesser trochanter. Minimal apex lateral angulation. Femoral head remains seated. No additional fracture of the bony pelvis. IMPRESSION: Displaced mildly comminuted intertrochanteric right hip fracture. Electronically Signed   By: Rubye Oaks M.D.   On: 02/15/2017 04:12    Procedures Procedures (including critical care time)  CRITICAL CARE-- hypotension, anemia requiring transfusion, multisystem failure Performed by: Rhona Raider   Total critical care time: 75 minutes  Critical care time was exclusive  of separately billable procedures and treating other patients.  Critical care was necessary to treat or prevent imminent or life-threatening deterioration.  Critical care was time spent personally by me on the following activities: development of treatment plan with patient and/or surrogate as well as nursing, discussions with consultants, evaluation of patient's response to treatment, examination of patient, obtaining history from patient or surrogate, ordering and performing treatments and interventions, ordering and review of laboratory studies, ordering and review of radiographic studies, pulse oximetry and re-evaluation of patient's condition.   Medications Ordered in ED Medications  sodium chloride 0.9 %  bolus 1,000 mL (0 mLs Intravenous Stopped 02/15/17 0648)    Followed by  0.9 %  sodium chloride infusion (1,000 mLs Intravenous New Bag/Given 02/15/17 0548)  0.9 %  sodium chloride infusion ( Intravenous New Bag/Given 02/15/17 0648)  pantoprazole (PROTONIX) 80 mg in sodium chloride 0.9 % 100 mL IVPB (not administered)  ondansetron (ZOFRAN) injection 4 mg (not administered)  0.9 %  sodium chloride infusion (not administered)  fentaNYL (SUBLIMAZE) injection 25 mcg (25 mcg Intravenous Given 02/15/17 0433)  fentaNYL (SUBLIMAZE) injection 50 mcg (50 mcg Intravenous Given 02/15/17 0647)     Initial Impression / Assessment and Plan / ED Course  I have reviewed the triage vital signs and the nursing notes.  Pertinent labs & imaging results that were available during my care of the patient were reviewed by me and considered in my medical decision making (see chart for details).     47 y.o. female here after mechanical fall just PTA landing on R hip, however she also reports that she's been feeling lightheaded x3-4 days and "wobbly" when ambulating, and having n/v/d x2 days with coffee ground emesis and melanotic stools. Hit her head but denies LOC. Reports chronic unchanged CP. Also reports neck  and low back pain since the fall, but mostly in R hip. On exam, BP 80-90s/30-40s which pt states is fairly typical for her; jaundiced appearing; very slow to respond but still A&Ox4; PERRL and EOMI, no nystagmus; no scalp tenderness or crepitus; mild diffuse cervical paraspinous muscle TTP but no focal midline tenderness; mild diffuse lumbar paraspinous and midline spinal TTP; significant tenderness to R hip and hip held in slight flexion and ext rotation, slightly shortened, +crepitus felt to hip. Distal pulses intact, dorsiflexion/plantarflexion symmetric bilaterally; diffuse abdominal tenderness throughout, +distension with likely ascites, nonperitoneal otherwise. Pt appears chronically ill and much older than stated age. States she recently got out of psych facility in [place], and was started on prozac, which she thought was why she was having lightheadedness. Work up thus far reveals: R hip xray showing displaced mildly comminuted intertrochanteric fx. Will need further work up in order to assess her other issues; will get labs, EKG, CXR, CT head/neck, lumbar xray, and give fluids (already received 2L, will give NS at 14025mL/hr as to avoid overloading her, since we don't know her full medical hx and whether she has CHF or not). Will give low dose fentanyl, being cautious of her BP. Will hold off on fecal occult testing for now since her hip limits this exam; will also hold off on orthostatics given that she can't do this with her hip fx. She will likely need admission but we need to finish her work up first. Discussed case with my attending Dr. Rush Landmarkegeler who agrees with plan.   6:44 AM CT tech reporting that they won't be able to do lumbar imaging due to her hip injury; will cancel this for now, it can be addressed later once the hip is better addressed. Awaiting labs/imaging. Will reassess shortly.   7:33 AM CBC w/diff showing Hgb 3.2/Hct 9.8 which is likely accurate given her reports of melena and  coffee ground emesis; pt has required transfusion before; does not have GI specialist she sees; differential still pending, as is platelet count. Will give protonix in addition to zofran and order PRBCs. CMP with low Na 127, low K 2.9, low Cl 94, BUN/Cr elevated at 61/1.67 respectively, low Ca 6.8, low protein 5.3, low albumin 1.7, elevated AST 1069 ALT 360  and Tbili 4.1 (unclear what her baseline is on any of these values). Lipase elevated at 73 (also unclear baseline). EtOH level undetectable. EKG with PVCs but otherwise no acute ischemic findings. Lactic unremarkable at 1.34. Trop neg. BetaHCG neg. APTT elevated at 44. PT/INR elevated at 28.1/2.65. Ammonia level WNL at 26. Remainder of labs pending. Will call GI, then ortho, and then likely hospitalist for admission. PRBCs ordered for transfusion.   7:58 AM Dr. Levora Angel of [facility] returning page and will consult on patient during her admission. Dr. Eulah Pont of ortho returning page and he will consult on pt while she's admitted, he prefers that she be transferred to [facility] for her hip surgery however he understands that she will need to be stabilized first and may not be able to be transferred yet. Differential and platelet count resulting and shows unremarkable differential, and plt 97 (unclear baseline). CT head/neck negative for acute findings, still awaiting CXR and urine studies. Will proceed with hospitalist admission.    8:25 AM Dr. Clearnce Sorrel of [facility] returning page and would like for PCCM to be called; if they decline her for admission then he will admit. Will reach out to [facility] now. Of note, CXR shows right 10th rib fx, otherwise no other acute findings.   8:40 AM Dr. Molli Knock of [facility] returning page and does not feel PCCM needs to admit pt; advised Dr. Clearnce Sorrel who will be admitting patient. Holding orders to be placed by admitting team. Please see their notes and consultant notes for further documentation of care. I appreciate all of their help with this  pleasant pt's care. Pt stable at time of admission.    Final Clinical Impressions(s) / ED Diagnoses   Final diagnoses:  Closed displaced intertrochanteric fracture of right femur, initial encounter (HCC)  Closed fracture of right hip, initial encounter (HCC)  Fall, initial encounter  Hypotension, unspecified hypotension type  Nausea vomiting and diarrhea  Lightheadedness  Hyponatremia  Hypokalemia  Hypoalbuminemia  Hyperbilirubinemia  Anemia, unspecified type  Acute GI bleeding  Thrombocytopenia (HCC)  Closed fracture of one rib of right side, initial encounter    ED Discharge Orders    [address], [place], [place] 02/15/17 8119    Tegeler, Canary Brim, MD 02/16/17 [phone]

## 2017-02-15 NOTE — ED Triage Notes (Signed)
[facility] of [facility]-slipped on floor-complaining of pain right hip with rotation and palpable deformity. EMS unable to administer pain control due to hypotension-palpable BP 80. NS 500 cc fluid bolus.

## 2017-02-15 NOTE — ED Notes (Signed)
RN notified of abnormal lab 

## 2017-02-15 NOTE — ED Notes (Signed)
Informed patient that a urine sample is needed, she stated she will give a sample when she is given some pain medicine, she has been here for 6hrs and is in terrible pain.

## 2017-02-15 NOTE — Consult Note (Signed)
Referring Provider:  ED provider Primary Care Physician:  Lavinia Sharps, NP Primary Gastroenterologist:  Gentry Fitz  Reason for Consultation:  GI bleed, hemoglobin 3.2. History of cirrhosis  HPI: Debbie Barker is a 47 y.o. female with past medical history of alcohol/hepatitis C induced cirrhosis, history of esophageal varices, history of prior GI bleed requiring admission at [facility] few years ago, history of chronic pancreatitis was brought to the hospital after having fall. Upon initial evaluation she was found to have hemoglobin of 3.2. She was also found to have displaced intertrochanteric right hip fracture. GI is consulted for further evaluation.  Patient seen and examined bedside. Patient is not able to give detailed history but according to patient she was admitted to [facility] few years ago with GI bleed and was diagnosed with cirrhosis. She has not followed up with any GI physician. History of chronic hepatitis C with no prior treatment. Patient is complaining of intermittent blood in the vomiting as well as intermittent dark stools for last many months. Recently she started noting large amount of blood in the vomiting. Opening of generalized abdominal pain, nausea and vomiting which is chronic according to patient.  No further vomiting since coming to Hospital. Had only one bowel movement since this morning which was dark colored   . EGD and colonoscopy 2 years ago at [facility]. Report not available to review.  Past Medical History:  Diagnosis Date  . Alcohol abuse 02/15/2017  . Alcoholic cirrhosis of liver with ascites (HCC) 02/15/2017  . Chronic pancreatitis (HCC)   . Secondary esophageal varices with bleeding (HCC) 02/15/2017      Prior to Admission medications   Medication Sig Start Date End Date Taking? Authorizing Provider  albuterol (PROVENTIL HFA;VENTOLIN HFA) 108 (90 Base) MCG/ACT inhaler Inhale 1-2 puffs into the  lungs every 6 (six) hours as needed for wheezing or shortness of breath.   Yes [provider]  Carvedilol (COREG PO) Take by mouth.   Yes [provider]  FLUoxetine HCl (PROZAC PO) Take by mouth.   Yes [provider]  Furosemide (LASIX PO) Take by mouth.   Yes [provider]  GABAPENTIN PO Take by mouth.   Yes [provider]  OMEPRAZOLE PO Take by mouth.   Yes [provider]    Scheduled Meds: . [START ON 02/18/2017] pantoprazole  40 mg Intravenous Q12H  . potassium chloride  40 mEq Oral Once   Continuous Infusions: . sodium chloride Stopped (02/15/17 0852)  . sodium chloride 125 mL/hr at 02/15/17 0648  . aztreonam    . octreotide  (SANDOSTATIN)    IV infusion    . pantoprozole (PROTONIX) infusion    . potassium chloride     PRN Meds:.  Allergies as of 02/15/2017 - Review Complete 02/15/2017  Allergen Reaction Noted  . Penicillins Other (See Comments) 02/15/2017    No family history on file.  Social History   Socioeconomic History  . Marital status: Single    Spouse name: Not on file  . Number of children: Not on file  . Years of education: Not on file  . Highest education level: Not on file  Social Needs  . Financial resource strain: Not on file  . Food insecurity - worry: Not on file  . Food insecurity - inability: Not on file  . Transportation needs - medical: Not on file  . Transportation needs - non-medical: Not on file  Occupational History  .  Not on file  Tobacco Use  . Smoking status: Not on file  Substance and Sexual Activity  . Alcohol use: Not on file  . Drug use: Not on file  . Sexual activity: Not on file  Other Topics Concern  . Not on file  Social History Narrative  . Not on file    Review of Systems: Limited review of systems per history of present illness  Physical Exam: Vital signs: Vitals:   02/15/17 0930 02/15/17 1000  BP: (!) 88/43 (!) 87/44  Pulse: 68 (!) 58  Resp: 14 10   Temp:    SpO2: 100% 100%     General:   Alert,  somewhat lethargic, in mild distress from pain HEENT : Dry lips, no oral lesion. Neck supple. Eyes - scleral icterus noted Lungs:  Clear to auscultate bilaterally. Anterior exam only. Heart:  Regular rate and rhythm; no murmurs, clicks, rubs,  or gallops. Abdomen: Generalized discomfort on palpation, no peritoneal signs, soft, mild distended, bowel sounds present.  Skin- Spider angioma anterior chest noted LE  - trace edema.  Rectal:  Deferred  GI:  Lab Results: Recent Labs    02/15/17 0616  WBC 4.7  HGB 3.2*  HCT 9.8*  PLT 97*   BMET Recent Labs    02/15/17 0616  NA 127*  K 2.9*  CL 94*  CO2 23  GLUCOSE 126*  BUN 61*  CREATININE 1.67*  CALCIUM 6.8*   LFT Recent Labs    02/15/17 0616  PROT 5.3*  ALBUMIN 1.7*  AST 1,069*  ALT 360*  ALKPHOS 86  BILITOT 4.1*   PT/INR Recent Labs    02/15/17 0616  LABPROT 28.1*  INR 2.65     Studies/Results: Dg Chest 2 View  Result Date: 02/15/2017 CLINICAL DATA:  Chest pain.  Fall. EXAM: CHEST  2 VIEW COMPARISON:  02/04/2017 FINDINGS: The heart size and mediastinal contours are within normal limits. Both lungs are clear. There is an acute fracture involving the anterior aspect of the right tenth rib. IMPRESSION: 1. Right tenth rib fracture. 2. No active cardiopulmonary abnormalities. Electronically Signed   By: Signa Kell M.D.   On: 02/15/2017 08:24   Ct Head Wo Contrast  Result Date: 02/15/2017 CLINICAL DATA:  Fall.  Slipped on floor. EXAM: CT HEAD WITHOUT CONTRAST CT CERVICAL SPINE WITHOUT CONTRAST TECHNIQUE: Multidetector CT imaging of the head and cervical spine was performed following the standard protocol without intravenous contrast. Multiplanar CT image reconstructions of the cervical spine were also generated. COMPARISON:  08/12/2013 FINDINGS: CT HEAD FINDINGS Brain: No evidence of acute infarction, hemorrhage, hydrocephalus, extra-axial collection or mass  lesion/mass effect. Vascular: No hyperdense vessel or unexpected calcification. Skull: Normal. Negative for fracture or focal lesion. Sinuses/Orbits: The mastoid air cells are clear. Partial opacification of right posterior ethmoid air cell. Other: None CT CERVICAL SPINE FINDINGS Alignment: Normal. Skull base and vertebrae: No acute fracture. No primary bone lesion or focal pathologic process. Soft tissues and spinal canal: No prevertebral fluid or swelling. No visible canal hematoma. Disc levels:  Unremarkable. Upper chest: Negative. Other: None IMPRESSION: 1. Normal brain. 2. No evidence for cervical spine fracture or dislocation. Electronically Signed   By: Signa Kell M.D.   On: 02/15/2017 07:37   Ct Cervical Spine Wo Contrast  Result Date: 02/15/2017 CLINICAL DATA:  Fall.  Slipped on floor. EXAM: CT HEAD WITHOUT CONTRAST CT CERVICAL SPINE WITHOUT CONTRAST TECHNIQUE: Multidetector CT imaging of the head and cervical spine was performed following the  standard protocol without intravenous contrast. Multiplanar CT image reconstructions of the cervical spine were also generated. COMPARISON:  08/12/2013 FINDINGS: CT HEAD FINDINGS Brain: No evidence of acute infarction, hemorrhage, hydrocephalus, extra-axial collection or mass lesion/mass effect. Vascular: No hyperdense vessel or unexpected calcification. Skull: Normal. Negative for fracture or focal lesion. Sinuses/Orbits: The mastoid air cells are clear. Partial opacification of right posterior ethmoid air cell. Other: None CT CERVICAL SPINE FINDINGS Alignment: Normal. Skull base and vertebrae: No acute fracture. No primary bone lesion or focal pathologic process. Soft tissues and spinal canal: No prevertebral fluid or swelling. No visible canal hematoma. Disc levels:  Unremarkable. Upper chest: Negative. Other: None IMPRESSION: 1. Normal brain. 2. No evidence for cervical spine fracture or dislocation. Electronically Signed   By: Signa Kellaylor  Stroud M.D.   On:  02/15/2017 07:37   Dg Hip Unilat  With Pelvis 2-3 Views Right  Result Date: 02/15/2017 CLINICAL DATA:  Fall out of bed this morning landing on right hip. Right hip pain and deformity. EXAM: DG HIP (WITH OR WITHOUT PELVIS) 2-3V RIGHT COMPARISON:  None. FINDINGS: Displaced mildly comminuted intertrochanteric right hip fracture with mild proximal migration of the femoral shaft. Mild displacement of the lesser trochanter. Minimal apex lateral angulation. Femoral head remains seated. No additional fracture of the bony pelvis. IMPRESSION: Displaced mildly comminuted intertrochanteric right hip fracture. Electronically Signed   By: Rubye OaksMelanie  Ehinger M.D.   On: 02/15/2017 04:12    Impression/Plan:  - Hematemesis  and melena. According to patient she has been having intermittent bleeding for many months. Given history of cirrhosis and esophageal varices, underlying variceal bleeding cannot be ruled out. Patient had no further emesis since admission. - Symptomatic anemia with hemoglobin of 3.2. - Liver cirrhosis probably from combination of alcohol use and history of hepatitis C. Ongoing alcohol use - Elevated LFTs. Combination of decompensated cirrhosis/alcohol use  - Thrombocytopenia - Displaced intertrochanteric right hip fracture  Recommendations ------------------------- - Continue Protonix drip. Continue octreotide drip. Patient has been started on antibiotics. - Transfuse as needed to keep hemoglobin around 7. - Plan for EGD tomorrow with possible band ligation. Procedure risks, benefits and alternatives discussed with the patient. She verbalized understanding. - Follow ultrasound liver. - Vitamin K - Repeat blood work tomorrow morning. HCV PCR with genotype - Prognosis guarded - GI will follow.   LOS: 0 days   Kathi DerParag Akeel Reffner  MD, FACP 02/15/2017, 10:57 AM  Contact #  [phone]

## 2017-02-15 NOTE — ED Notes (Signed)
The labs were collected at 20:25 before the blood started

## 2017-02-15 NOTE — ED Notes (Signed)
Mercedes verbal transfusion to 250 per hr

## 2017-02-16 ENCOUNTER — Inpatient Hospital Stay (HOSPITAL_COMMUNITY): Payer: Medicaid Other | Admitting: Anesthesiology

## 2017-02-16 ENCOUNTER — Encounter (HOSPITAL_COMMUNITY): Payer: Self-pay

## 2017-02-16 ENCOUNTER — Encounter (HOSPITAL_COMMUNITY)
Admission: EM | Disposition: A | Discharge status: Hospice | Payer: Self-pay | Source: Home / Self Care | Attending: Family Medicine

## 2017-02-16 HISTORY — PX: ESOPHAGOGASTRODUODENOSCOPY (EGD) WITH PROPOFOL: SHX5813

## 2017-02-16 LAB — HEMOGLOBIN AND HEMATOCRIT, BLOOD
HCT: 20.7 % — ABNORMAL LOW (ref 36.0–46.0)
HCT: 23 % — ABNORMAL LOW (ref 36.0–46.0)
HCT: 21.8 % — ABNORMAL LOW (ref 36.0–46.0)
HEMATOCRIT: 21.1 % — AB (ref 36.0–46.0)
HEMOGLOBIN: 6.9 g/dL — AB (ref 12.0–15.0)
Hemoglobin: 7 g/dL — ABNORMAL LOW (ref 12.0–15.0)
Hemoglobin: 7.9 g/dL — ABNORMAL LOW (ref 12.0–15.0)
Hemoglobin: 7.5 g/dL — ABNORMAL LOW (ref 12.0–15.0)

## 2017-02-16 LAB — COMPREHENSIVE METABOLIC PANEL WITH GFR
AST: 714 U/L — ABNORMAL HIGH (ref 15–41)
BUN: 53 mg/dL — ABNORMAL HIGH (ref 6–20)
Chloride: 101 mmol/L (ref 101–111)
GFR calc non Af Amer: 43 mL/min — ABNORMAL LOW (ref >60)
Potassium: 4.5 mmol/L (ref 3.5–5.1)
Sodium: 129 mmol/L — ABNORMAL LOW (ref 135–145)

## 2017-02-16 LAB — COMPREHENSIVE METABOLIC PANEL
ALT: 350 U/L — ABNORMAL HIGH (ref 14–54)
Albumin: 2 g/dL — ABNORMAL LOW (ref 3.5–5.0)
Alkaline Phosphatase: 106 U/L (ref 38–126)
Anion gap: 7 (ref 5–15)
CO2: 21 mmol/L — ABNORMAL LOW (ref 22–32)
Calcium: 7.5 mg/dL — ABNORMAL LOW (ref 8.9–10.3)
Creatinine, Ser: 1.42 mg/dL — ABNORMAL HIGH (ref 0.44–1.00)
GFR calc Af Amer: 50 mL/min — ABNORMAL LOW (ref >60)
Glucose, Bld: 119 mg/dL — ABNORMAL HIGH (ref 65–99)
Total Bilirubin: 9.1 mg/dL — ABNORMAL HIGH (ref 0.3–1.2)
Total Protein: 6.3 g/dL — ABNORMAL LOW (ref 6.5–8.1)

## 2017-02-16 LAB — CBC
HEMATOCRIT: 20 % — AB (ref 36.0–46.0)
Hemoglobin: 7 g/dL — ABNORMAL LOW (ref 12.0–15.0)
MCH: 30.8 pg (ref 26.0–34.0)
MCHC: 35 g/dL (ref 30.0–36.0)
MCV: 88.1 fL (ref 78.0–100.0)
Platelets: 153 10*3/uL (ref 150–400)
RBC: 2.27 MIL/uL — AB (ref 3.87–5.11)
RDW: 20 % — AB (ref 11.5–15.5)
WBC: 7.7 10*3/uL (ref 4.0–10.5)

## 2017-02-16 LAB — HIV ANTIBODY (ROUTINE TESTING W REFLEX): HIV Screen 4th Generation wRfx: NONREACTIVE

## 2017-02-16 LAB — GLUCOSE, CAPILLARY
Glucose-Capillary: 126 mg/dL — ABNORMAL HIGH (ref 65–99)
Glucose-Capillary: 136 mg/dL — ABNORMAL HIGH (ref 65–99)
Glucose-Capillary: 156 mg/dL — ABNORMAL HIGH (ref 65–99)
Glucose-Capillary: 231 mg/dL — ABNORMAL HIGH (ref 65–99)

## 2017-02-16 LAB — HSV DNA BY PCR (REFERENCE LAB): HSV 2 DNA: NEGATIVE

## 2017-02-16 LAB — MAGNESIUM: Magnesium: 2.1 mg/dL (ref 1.7–2.4)

## 2017-02-16 LAB — PROTIME-INR
INR: 2.13
Prothrombin Time: 23.7 seconds — ABNORMAL HIGH (ref 11.4–15.2)

## 2017-02-16 LAB — PREPARE RBC (CROSSMATCH)

## 2017-02-16 LAB — MRSA PCR SCREENING: MRSA by PCR: NEGATIVE

## 2017-02-16 LAB — APTT: aPTT: 40 s — ABNORMAL HIGH (ref 24–36)

## 2017-02-16 LAB — HERPES SIMPLEX VIRUS(HSV) DNA BY PCR: HSV 1 DNA: NEGATIVE

## 2017-02-16 SURGERY — ESOPHAGOGASTRODUODENOSCOPY (EGD) WITH PROPOFOL
Anesthesia: Monitor Anesthesia Care

## 2017-02-16 MED ORDER — PROPOFOL 10 MG/ML IV BOLUS
INTRAVENOUS | Status: AC
  Filled 2017-02-16: qty 20

## 2017-02-16 MED ORDER — PHENOL 1.4 % MT LIQD
1.0000 | OROMUCOSAL | Status: DC | PRN
Start: 2017-02-16 — End: 2017-02-26
  Administered 2017-02-16: 1 via OROMUCOSAL
  Filled 2017-02-16: qty 177

## 2017-02-16 MED ORDER — ALBUTEROL SULFATE (2.5 MG/3ML) 0.083% IN NEBU: 2.5000 mg | INHALATION_SOLUTION | Freq: Four times a day (QID) | RESPIRATORY_TRACT | Status: DC | PRN

## 2017-02-16 MED ORDER — VITAMIN K1 10 MG/ML IJ SOLN
10.0000 mg | Freq: Once | INTRAVENOUS | Status: AC
  Administered 2017-02-16: 10 mg via INTRAVENOUS
  Filled 2017-02-16: qty 1

## 2017-02-16 MED ORDER — AZTREONAM IN DEXTROSE 2 GM/50ML IV SOLN
2.0000 g | Freq: Two times a day (BID) | INTRAVENOUS | Status: DC
  Administered 2017-02-16 – 2017-02-17 (×3): 2 g via INTRAVENOUS
  Filled 2017-02-16 (×5): qty 50

## 2017-02-16 MED ORDER — PROPOFOL 10 MG/ML IV BOLUS
INTRAVENOUS | Status: DC | PRN
  Administered 2017-02-16 (×9): 20 mg via INTRAVENOUS

## 2017-02-16 MED ORDER — SODIUM CHLORIDE 0.9 % IV SOLN
Freq: Once | INTRAVENOUS | Status: AC
  Administered 2017-02-16: 12:00:00 via INTRAVENOUS

## 2017-02-16 MED ORDER — PANTOPRAZOLE SODIUM 40 MG IV SOLR
40.0000 mg | Freq: Two times a day (BID) | INTRAVENOUS | Status: DC
  Administered 2017-02-16 – 2017-02-22 (×12): 40 mg via INTRAVENOUS
  Filled 2017-02-16 (×12): qty 40

## 2017-02-16 MED ORDER — ORAL CARE MOUTH RINSE
15.0000 mL | Freq: Two times a day (BID) | OROMUCOSAL | Status: DC
  Administered 2017-02-16 – 2017-02-26 (×16): 15 mL via OROMUCOSAL

## 2017-02-16 SURGICAL SUPPLY — 14 items

## 2017-02-16 NOTE — Progress Notes (Signed)
PROGRESS NOTE    Debbie Barker  RUE:[id number] DOB: 08-03-1970 DOA: 02/15/2017 PCP: Lavinia Sharps, NP    Brief Narrative:  47 y.o. female with medical history significant of alcoholic cirrhosis complicated by chronic pancreatitis, esophageal varices with prior GI bleeds, ascites, active alcohol abuse, depression who comes in with fall and severe leg pain in the setting of presyncopal episode after several days of hematemesis.   Assessment & Plan:   Principal Problem:   Acute upper GI bleeding - GI consulted - last hemoglobin less than 8.0 will plan on transfusing 1 unit of PRBC. - pt is on octreotide and protonix  Active Problems:   Alcoholic cirrhosis of liver with ascites (HCC)/Secondary esophageal varices with bleeding (HCC) - Gi planning to scope patient for further evaluation and possible banding - otherwise patient is on octreotide and protonix as mentioned above.    AKI (acute kidney injury) (HCC) - Will continue to monitor and reassess with bmp next am. - not enough reported values in the chart to know baseline creatinine level  Right hip fracture: in the setting of fall -  Intertrochanteric right hip fracture - supportive therapy - Ortho on board and assisting. Plan for surgical repair. GI would like to scope patient and band any varices that may require banding.    Alcohol abuse - Pt has thiamine and ativan for withdrawal symptoms.    Acute on chronic alcoholic liver disease (HCC) - GI on board  DVT prophylaxis: SCD's Code Status: DNR Family Communication: none at bedside. Disposition Plan: pending improvement in condition   Consultants:   Ortho: Dr. Primus Bravo  GI: Brahmbhatt   Procedures: pending   Antimicrobials: Aztreonam   Subjective: Pt has no new complaints currently on my exam. States that her mobility was limited.  Objective: Vitals:   02/16/17 0600 02/16/17 0700 02/16/17 0755 02/16/17 1000  BP: (!) 97/50 (!) 106/46  (!) 91/48  Pulse:  (!) 59 62  63  Resp: 11 13  (!) 9  Temp:   (!) 97.4 F (36.3 C)   TempSrc:   Oral   SpO2: 98% 96%  100%  Weight:      Height:        Intake/Output Summary (Last 24 hours) at 02/16/2017 1128 Last data filed at 02/16/2017 1100 Gross per 24 hour  Intake 5250.09 ml  Output 1500 ml  Net 3750.09 ml   Filed Weights   02/15/17 2106 02/16/17 0045  Weight: 72.6 kg (160 lb) 62.3 kg (137 lb 5.6 oz)    Examination:  General exam: Appears calm and comfortable, in nad. Respiratory system: Clear to auscultation. Respiratory effort normal. Cardiovascular system: S1 & S2 heard, RRR. No JVD, murmurs, or rubs Gastrointestinal system: Abdomen is distended, soft and nontender Central nervous system: Alert and oriented. No focal neurological deficits. Extremities: warm and dry Skin: No rashes, lesions or ulcers Psychiatry:  Mood & affect appropriate.     Data Reviewed: I have personally reviewed following labs and imaging studies  CBC: Recent Labs  Lab 02/15/17 0616 02/15/17 1618 02/15/17 2000 02/16/17 0126 02/16/17 0539 02/16/17 1003  WBC 4.7  --   --  7.7  --   --   NEUTROABS 2.8  --   --   --   --   --   HGB 3.2* 4.9* 5.9* 7.0* 7.0* 6.9*  HCT 9.8* 15.0* 17.1* 20.0* 20.7* 21.1*  MCV 91.6  --   --  88.1  --   --   PLT 97*  --   --  153  --   --    Basic Metabolic Panel: Recent Labs  Lab 02/15/17 0616 02/15/17 1618 02/16/17 0126  NA 127* 132* 129*  K 2.9* 2.8* 4.5  CL 94* 110 101  CO2 23 19* 21*  GLUCOSE 126* 86 119*  BUN 61* 48* 53*  CREATININE 1.67* 1.21* 1.42*  CALCIUM 6.8* 5.6* 7.5*  MG  --   --  2.1   GFR: Estimated Creatinine Clearance: 42.3 mL/min (A) (by C-G formula based on SCr of 1.42 mg/dL (H)). Liver Function Tests: Recent Labs  Lab 02/15/17 0616 02/16/17 0126  AST 1,069* 714*  ALT 360* 350*  ALKPHOS 86 106  BILITOT 4.1* 9.1*  PROT 5.3* 6.3*  ALBUMIN 1.7* 2.0*   Recent Labs  Lab 02/15/17 0616  LIPASE 73*   Recent Labs  Lab 02/15/17 0644    AMMONIA 26   Coagulation Profile: Recent Labs  Lab 02/15/17 0616 02/16/17 0126  INR 2.65 2.13   Cardiac Enzymes: No results for input(s): CKTOTAL, CKMB, CKMBINDEX, TROPONINI in the last 168 hours. BNP (last 3 results) No results for input(s): PROBNP in the last 8760 hours. HbA1C: No results for input(s): HGBA1C in the last 72 hours. CBG: Recent Labs  Lab 02/16/17 0758  GLUCAP 126*   Lipid Profile: No results for input(s): CHOL, HDL, LDLCALC, TRIG, CHOLHDL, LDLDIRECT in the last 72 hours. Thyroid Function Tests: No results for input(s): TSH, T4TOTAL, FREET4, T3FREE, THYROIDAB in the last 72 hours. Anemia Panel: No results for input(s): VITAMINB12, FOLATE, FERRITIN, TIBC, IRON, RETICCTPCT in the last 72 hours. Sepsis Labs: Recent Labs  Lab 02/15/17 0709  LATICACIDVEN 1.34    Recent Results (from the past 240 hour(s))  MRSA PCR Screening     Status: None   Collection Time: 02/16/17 12:32 AM  Result Value Ref Range Status   MRSA by PCR NEGATIVE NEGATIVE Final    Comment:        The GeneXpert MRSA Assay (FDA approved for NASAL specimens only), is one component of a comprehensive MRSA colonization surveillance program. It is not intended to diagnose MRSA infection nor to guide or monitor treatment for MRSA infections. Performed at [facility], [address], [place], [place] [address]      Radiology Studies: Dg Chest 2 View  Result Date: 02/15/2017 CLINICAL DATA:  Chest pain.  Fall. EXAM: CHEST  2 VIEW COMPARISON:  02/04/2017 FINDINGS: The heart size and mediastinal contours are within normal limits. Both lungs are clear. There is an acute fracture involving the anterior aspect of the right tenth rib. IMPRESSION: 1. Right tenth rib fracture. 2. No active cardiopulmonary abnormalities. Electronically Signed   By: Signa Kell M.D.   On: 02/15/2017 08:24   Ct Head Wo Contrast  Result Date: 02/15/2017 CLINICAL DATA:  Fall.  Slipped on floor.  EXAM: CT HEAD WITHOUT CONTRAST CT CERVICAL SPINE WITHOUT CONTRAST TECHNIQUE: Multidetector CT imaging of the head and cervical spine was performed following the standard protocol without intravenous contrast. Multiplanar CT image reconstructions of the cervical spine were also generated. COMPARISON:  08/12/2013 FINDINGS: CT HEAD FINDINGS Brain: No evidence of acute infarction, hemorrhage, hydrocephalus, extra-axial collection or mass lesion/mass effect. Vascular: No hyperdense vessel or unexpected calcification. Skull: Normal. Negative for fracture or focal lesion. Sinuses/Orbits: The mastoid air cells are clear. Partial opacification of right posterior ethmoid air cell. Other: None CT CERVICAL SPINE FINDINGS Alignment: Normal. Skull base and vertebrae: No acute fracture. No primary bone lesion or focal pathologic process.  Soft tissues and spinal canal: No prevertebral fluid or swelling. No visible canal hematoma. Disc levels:  Unremarkable. Upper chest: Negative. Other: None IMPRESSION: 1. Normal brain. 2. No evidence for cervical spine fracture or dislocation. Electronically Signed   By: Signa Kell M.D.   On: 02/15/2017 07:37   Ct Cervical Spine Wo Contrast  Result Date: 02/15/2017 CLINICAL DATA:  Fall.  Slipped on floor. EXAM: CT HEAD WITHOUT CONTRAST CT CERVICAL SPINE WITHOUT CONTRAST TECHNIQUE: Multidetector CT imaging of the head and cervical spine was performed following the standard protocol without intravenous contrast. Multiplanar CT image reconstructions of the cervical spine were also generated. COMPARISON:  08/12/2013 FINDINGS: CT HEAD FINDINGS Brain: No evidence of acute infarction, hemorrhage, hydrocephalus, extra-axial collection or mass lesion/mass effect. Vascular: No hyperdense vessel or unexpected calcification. Skull: Normal. Negative for fracture or focal lesion. Sinuses/Orbits: The mastoid air cells are clear. Partial opacification of right posterior ethmoid air cell. Other: None CT  CERVICAL SPINE FINDINGS Alignment: Normal. Skull base and vertebrae: No acute fracture. No primary bone lesion or focal pathologic process. Soft tissues and spinal canal: No prevertebral fluid or swelling. No visible canal hematoma. Disc levels:  Unremarkable. Upper chest: Negative. Other: None IMPRESSION: 1. Normal brain. 2. No evidence for cervical spine fracture or dislocation. Electronically Signed   By: Signa Kell M.D.   On: 02/15/2017 07:37   US Abdomen Complete  Result Date: 02/15/2017 CLINICAL DATA:  Liver failure. EXAM: ABDOMEN ULTRASOUND COMPLETE COMPARISON:  CT scan 02/04/2017 FINDINGS: Gallbladder: Surgically absent. Common bile duct: Diameter: 5.8 mm Liver: Cirrhotic changes involving the liver with confluent hepatic fibrosis and regenerating nodules. No obvious focal hepatic lesion. IVC: Normal caliber. Pancreas: Not visualized. Spleen: Normal size.  No focal lesions. Right Kidney: Length: 11.0 cm. Normal renal cortical thickness and echogenicity without focal lesions or hydronephrosis. Left Kidney: Length: 12.2 cm. Normal renal cortical thickness and echogenicity without focal lesions or hydronephrosis. Abdominal aorta: No aneurysm visualized. Other findings: None. Portal vein velocities: Proximal 67.6 centimeter/second. Mid: 59.2 centimeter/second. Distal: 50.9 centimeter/second. Hepatopetal flow. Right portal vein: 48 centimeter/second.  Hepatopetal flow. Left portal vein 89 cm per second.  Hepatopetal flow. Hepatic veins: Right: 41.9 centimeter/second.  Hepatofugal flow. Middle: 41.2 cm second.  Hepatofugal flow. Left: 62.2 centimeter/second.  Hepatofugal flow. Intrahepatic IVC: Patent Hepatic artery: 131.6 centimeter/second. Splenic vein: 19.5 centimeter/second. Splenic size: 12.4 x 5.3 x 4.2 cm.  Volume is 144 cubic cm. No portal vein occlusion or thrombus No splenic vein occlusion or thrombus Ascites is present. No obvious varices. IMPRESSION: 1. Advanced cirrhotic changes involving the  liver but no focal hepatic lesion. 2. Status post cholecystectomy.  No biliary dilatation. 3. Normal size spleen. 4. Normal hepatopetal flow in the portal vein. No occlusion or thrombus. Electronically Signed   By: Rudie Meyer M.D.   On: 02/15/2017 21:17   US Liver Doppler  Result Date: 02/15/2017 CLINICAL DATA:  Liver failure. EXAM: ABDOMEN ULTRASOUND COMPLETE COMPARISON:  CT scan 02/04/2017 FINDINGS: Gallbladder: Surgically absent. Common bile duct: Diameter: 5.8 mm Liver: Cirrhotic changes involving the liver with confluent hepatic fibrosis and regenerating nodules. No obvious focal hepatic lesion. IVC: Normal caliber. Pancreas: Not visualized. Spleen: Normal size.  No focal lesions. Right Kidney: Length: 11.0 cm. Normal renal cortical thickness and echogenicity without focal lesions or hydronephrosis. Left Kidney: Length: 12.2 cm. Normal renal cortical thickness and echogenicity without focal lesions or hydronephrosis. Abdominal aorta: No aneurysm visualized. Other findings: None. Portal vein velocities: Proximal 67.6 centimeter/second. Mid: 59.2 centimeter/second. Distal:  50.9 centimeter/second. Hepatopetal flow. Right portal vein: 48 centimeter/second.  Hepatopetal flow. Left portal vein 89 cm per second.  Hepatopetal flow. Hepatic veins: Right: 41.9 centimeter/second.  Hepatofugal flow. Middle: 41.2 cm second.  Hepatofugal flow. Left: 62.2 centimeter/second.  Hepatofugal flow. Intrahepatic IVC: Patent Hepatic artery: 131.6 centimeter/second. Splenic vein: 19.5 centimeter/second. Splenic size: 12.4 x 5.3 x 4.2 cm.  Volume is 144 cubic cm. No portal vein occlusion or thrombus No splenic vein occlusion or thrombus Ascites is present. No obvious varices. IMPRESSION: 1. Advanced cirrhotic changes involving the liver but no focal hepatic lesion. 2. Status post cholecystectomy.  No biliary dilatation. 3. Normal size spleen. 4. Normal hepatopetal flow in the portal vein. No occlusion or thrombus. Electronically  Signed   By: Rudie MeyerP.  Gallerani M.D.   On: 02/15/2017 21:17   Dg Hip Unilat  With Pelvis 2-3 Views Right  Result Date: 02/15/2017 CLINICAL DATA:  Fall out of bed this morning landing on right hip. Right hip pain and deformity. EXAM: DG HIP (WITH OR WITHOUT PELVIS) 2-3V RIGHT COMPARISON:  None. FINDINGS: Displaced mildly comminuted intertrochanteric right hip fracture with mild proximal migration of the femoral shaft. Mild displacement of the lesser trochanter. Minimal apex lateral angulation. Femoral head remains seated. No additional fracture of the bony pelvis. IMPRESSION: Displaced mildly comminuted intertrochanteric right hip fracture. Electronically Signed   By: Rubye OaksMelanie  Ehinger M.D.   On: 02/15/2017 04:12   Scheduled Meds: . folic acid  1 mg Oral Daily  . mouth rinse  15 mL Mouth Rinse BID  . multivitamin with minerals  1 tablet Oral Daily  . [START ON 02/18/2017] pantoprazole  40 mg Intravenous Q12H  . prednisoLONE  60 mg Oral QAC breakfast  . sodium chloride flush  3 mL Intravenous Q12H  . thiamine  100 mg Oral Daily   Or  . thiamine  100 mg Intravenous Daily   Continuous Infusions: . sodium chloride 100 mL/hr at 02/16/17 0452  . sodium chloride    . aztreonam Stopped (02/16/17 1003)  . octreotide  (SANDOSTATIN)    IV infusion 50 mcg/hr (02/16/17 0454)  . pantoprozole (PROTONIX) infusion 8 mg/hr (02/16/17 0456)     LOS: 1 day    Time spent: > 35 minutes  Penny Piarlando Jewelle Whitner, MD [facility] Pager [phone]  If 7PM-7AM, please contact night-coverage [email or web address] Password [facility] 02/16/2017, 11:28 AM

## 2017-02-16 NOTE — Brief Op Note (Addendum)
02/15/2017 - 02/16/2017  1:46 PM  PATIENT:  Debbie Barker  47 y.o. female  PRE-OPERATIVE DIAGNOSIS:  Possible variceal bleed  POST-OPERATIVE DIAGNOSIS:  4 bands placed for esophageal varicies  PROCEDURE:  Procedure(s): ESOPHAGOGASTRODUODENOSCOPY (EGD) WITH PROPOFOL (N/A)  SURGEON:  Surgeon(s) and Role:    * Haru Anspaugh, MD - Primary  Findings ------------ - EGD showed large esophageal varices with stigmata of recent bleeding. 4 bands placed. 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  Recommendations ------------------------- - Continue octreotide drip for total 72 hours. - Change Protonix to IV twice a day - Continue antibiotics - Monitor H&H. Transfuse to keep hemoglobin around 7-8. Avoid over transfusion. - Repeat EGD for further band ligation in 3-4 weeks. - Okay to proceed with orthopedic intervention from GI standpoint. Patient is not a candidate for anticoagulation at least for next few weeks. - GI will follow - Okay to have a clear liquid diet today if there is no plan for orthopedic intervention today - D/W Dr. Lawanna KobusVega  Fitzroy Mikami MD, FACP 02/16/2017, 1:48 PM  Contact #  [phone]

## 2017-02-16 NOTE — Op Note (Signed)
[facility] Patient Name: Debbie MawDena Adderley Procedure Date: 02/16/2017 MRN: [id number] Attending MD: Kathi DerParag Madgeline Rayo , MD Date of Birth: 04-Aug-1970 CSN: [id number] Age: 47 Admit Type: Inpatient Procedure:                Upper GI endoscopy Indications:              Hematemesis, Cirrhosis with suspected esophageal                            varices, For therapy of esophageal varices Providers:                Kathi DerParag Ming Kunka, MD, Jacquiline DoeJennifer Zhu, RN, Arlee Muslimhris                            Chandler Tech., Technician, Albertina SenegalStephen R. Alday CRNA,                            CRNA Referring MD:              Medicines:                Sedation Administered by an Anesthesia Professional Complications:            No immediate complications. Estimated Blood Loss:     Estimated blood loss was minimal. Procedure:                Pre-Anesthesia Assessment:                           - Prior to the procedure, a History and Physical                            was performed, and patient medications and                            allergies were reviewed. The patient's tolerance of                            previous anesthesia was also reviewed. The risks                            and benefits of the procedure and the sedation                            options and risks were discussed with the patient.                            All questions were answered, and informed consent                            was obtained. Prior Anticoagulants: The patient has                            taken no previous anticoagulant or antiplatelet  agents. ASA Grade Assessment: III - A patient with                            severe systemic disease. After reviewing the risks                            and benefits, the patient was deemed in                            satisfactory condition to undergo the procedure.                           After obtaining informed consent, the endoscope was                      passed under direct vision. Throughout the                            procedure, the patient's blood pressure, pulse, and                            oxygen saturations were monitored continuously. The                            Endoscope was introduced through the mouth, and                            advanced to the second part of duodenum. The upper                            GI endoscopy was accomplished without difficulty.                            The patient tolerated the procedure well. Scope In: Scope Out: Findings:      Two columns of non-bleeding large (> 5 mm) varices were found in the mid       esophagus and in the distal esophagus,. Stigmata of recent bleeding were       evident and red wale signs were present. Four bands were successfully       placed with complete eradication, resulting in deflation of varices.       Very minimal oozing of blood after band ligation which stopped       spontaneously.      Severe portal hypertensive gastropathy was found in the gastric fundus.      Mild portal hypertensive gastropathy was found in the gastric body.      The duodenal bulb, first portion of the duodenum and second portion of       the duodenum were normal. Impression:               - Recently bleeding large (> 5 mm) esophageal                            varices. Completely eradicated. Banded.                           -  Portal hypertensive gastropathy.                           - Portal hypertensive gastropathy.                           - Normal duodenal bulb, first portion of the                            duodenum and second portion of the duodenum.                           - No specimens collected. Moderate Sedation:      Moderate (conscious) sedation was personally administered by an       anesthesia professional. The following parameters were monitored: oxygen       saturation, heart rate, blood pressure, and response to care. Recommendation:            - Return patient to hospital ward for ongoing care.                           - NPO.                           - Continue present medications.                           - Repeat upper endoscopy in 4 weeks for retreatment. Procedure Code(s):        --- Professional ---                           [phone], Esophagogastroduodenoscopy, flexible,                            transoral; with band ligation of esophageal/gastric                            varices Diagnosis Code(s):        --- Professional ---                           K74.60, Unspecified cirrhosis of liver                           I85.11, Secondary esophageal varices with bleeding                           K76.6, Portal hypertension                           K31.89, Other diseases of stomach and duodenum                           K92.0, Hematemesis CPT copyright 2016 American Medical Association. All rights reserved. The codes documented in this report are preliminary and upon coder review may  be revised to meet current compliance requirements. Kathi Der, MD Kathi Der, MD 02/16/2017 1:44:02 PM Number of Addenda: 0

## 2017-02-16 NOTE — Transfer of Care (Signed)
Immediate Anesthesia Transfer of Care Note  Patient: Debbie Barker  Procedure(s) Performed: ESOPHAGOGASTRODUODENOSCOPY (EGD) WITH PROPOFOL (N/A )  Patient Location: PACU  Anesthesia Type:MAC  Level of Consciousness: sedated  Airway & Oxygen Therapy: Patient Spontanous Breathing and Patient connected to nasal cannula oxygen  Post-op Assessment: Report given to RN and Post -op Vital signs reviewed and stable  Post vital signs: Reviewed and stable  Last Vitals:  Vitals:   02/16/17 1225 02/16/17 1307  BP: (!) 104/53 (!) 95/44  Pulse: 62 68  Resp: (!) 9 15  Temp: 36.6 C 36.8 C  SpO2: 98% 96%    Last Pain:  Vitals:   02/16/17 1307  TempSrc: Oral  PainSc:       Patients Stated Pain Goal: 3 (42/59/56 3875)  Complications: No apparent anesthesia complications

## 2017-02-16 NOTE — Anesthesia Preprocedure Evaluation (Signed)
Anesthesia Evaluation  Patient identified by MRN, date of birth, ID band Patient awake    Reviewed: Allergy & Precautions, NPO status , Patient's Chart, lab work & pertinent test results  Airway Mallampati: II   Neck ROM: full    Dental   Pulmonary Current Smoker,    breath sounds clear to auscultation       Cardiovascular negative cardio ROS   Rhythm:regular Rate:Normal     Neuro/Psych    GI/Hepatic (+) Cirrhosis   ascites  substance abuse  alcohol use, Possible variceal bleed   Endo/Other    Renal/GU      Musculoskeletal   Abdominal   Peds  Hematology  (+) Blood dyscrasia, anemia ,   Anesthesia Other Findings   Reproductive/Obstetrics                             Anesthesia Physical Anesthesia Plan  ASA: III  Anesthesia Plan: MAC   Post-op Pain Management:    Induction: Intravenous  PONV Risk Score and Plan: 1 and Propofol infusion and Treatment may vary due to age or medical condition  Airway Management Planned: Nasal Cannula  Additional Equipment:   Intra-op Plan:   Post-operative Plan:   Informed Consent: I have reviewed the patients History and Physical, chart, labs and discussed the procedure including the risks, benefits and alternatives for the proposed anesthesia with the patient or authorized representative who has indicated his/her understanding and acceptance.     Plan Discussed with: CRNA, Anesthesiologist and Surgeon  Anesthesia Plan Comments:         Anesthesia Quick Evaluation

## 2017-02-16 NOTE — Progress Notes (Signed)
CRITICAL VALUE ALERT  Critical Value:  Hgb=6.9  Date & Time Notied:  02/16/17  10.05  Provider Notified: Yes  Orders Received/Actions taken: 1 U PRBC (given)

## 2017-02-16 NOTE — Progress Notes (Signed)
Dr. Eulah PontMurphy asked me to consider patient for operative fixation of hip fracture this weekend. Had upper gi today and hemoglobin still lower than we would like. Will tentatively plan for surgery when stable [facility] and staff present potentially Sunday or Monday. Will discuss w team tomorrow.
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
## 2017-02-16 NOTE — Progress Notes (Signed)
[facility] Progress Note  Debbie MawDena Barker [age] y.o. 11-Nov-1970  CC:  Symptomatic anemia, cirrhosis, history of esophageal varices   Subjective: No further bleeding episodes since admission. No globin improved to 7 after blood transfusion. INR also improving. Continues to have generalized abdominal discomfort.   ROS : Positive for lower extremity weakness and pain. Negative for active shortness of breath.   Objective: Vital signs in last 24 hours: Vitals:   02/16/17 1200 02/16/17 1225  BP: (!) 104/50 (!) 104/53  Pulse: 65 62  Resp:  (!) 9  Temp: 97.9 F (36.6 C) 97.9 F (36.6 C)  SpO2:  98%    Physical Exam:  General:    alert and oriented 3. Not in acute distress Eyes - scleral icterus noted Lungs:  Clear to auscultate bilaterally. Anterior exam only. Heart:  Regular rate and rhythm; no murmurs, clicks, rubs,  or gallops. Abdomen:  , nontender, no peritoneal signs, soft, mild distended, bowel sounds present.  Skin- Spider angioma anterior chest noted    Lab Results: Recent Labs    02/15/17 1618 02/16/17 0126  NA 132* 129*  K 2.8* 4.5  CL 110 101  CO2 19* 21*  GLUCOSE 86 119*  BUN 48* 53*  CREATININE 1.21* 1.42*  CALCIUM 5.6* 7.5*  MG  --  2.1   Recent Labs    02/15/17 0616 02/16/17 0126  AST 1,069* 714*  ALT 360* 350*  ALKPHOS 86 106  BILITOT 4.1* 9.1*  PROT 5.3* 6.3*  ALBUMIN 1.7* 2.0*   Recent Labs    02/15/17 0616  02/16/17 0126 02/16/17 0539 02/16/17 1003  WBC 4.7  --  7.7  --   --   NEUTROABS 2.8  --   --   --   --   HGB 3.2*   < > 7.0* 7.0* 6.9*  HCT 9.8*   < > 20.0* 20.7* 21.1*  MCV 91.6  --  88.1  --   --   PLT 97*  --  153  --   --    < > = values in this interval not displayed.   Recent Labs    02/15/17 0616 02/16/17 0126  LABPROT 28.1* 23.7*  INR 2.65 2.13      Assessment/Plan: - Hematemesis  and melena. According to patient she has been having intermittent bleeding for many months. Given history of cirrhosis and  esophageal varices, underlying variceal bleeding cannot be ruled out. Patient had no further emesis since admission. - Symptomatic anemia with hemoglobin of 3.2. Status post blood transfusion. Hemoglobin improved to 7 - Liver cirrhosis probably from combination of alcohol use and history of hepatitis C. Ongoing alcohol use - Elevated LFTs. Combination of decompensated cirrhosis/alcoholic hepatitis  - Thrombocytopenia - Displaced intertrochanteric right hip fracture  Recommendations -------------------------  - EGD today with possible band ligation. Procedure risks, benefits and alternatives discussed with the patient. She verbalized understanding. - Continue PPI, octreotide drip and antibiotics for now -  ultrasound showed advanced cirrhosis otherwise no acute changes. - HCV PCR with genotype - MELD score 30.   discriminant score 48.2. Based on today's lab.  -  Not a candidate for prednisone because of active GI bleed -  Long-term prognosis poor because of advance meld score - Not a candidate for liver transplant because of ongoing alcohol use - Absolute alcohol abstinence advised.  - GI will follow.  Debbie DerParag Marlee Trentman MD, FACP 02/16/2017, 1:07 PM  Contact #  [phone]

## 2017-02-16 NOTE — Care Management Note (Signed)
Case Management Note  Patient Details  Name: Marinus MawDena Morr MRN: [id number] Date of Birth: Dec 14, 1970  Subjective/Objective:                  Fall rt hip pain with closed intertrochanteric fracture/anemia from gi bleed/hyponatremia  Action/Plan: Date: February 16, 2017 Marcelle SmilingRhonda Timber Lucarelli, BSN, [place]RN3, [place]CC[place] [phone] Chart and notes review for patient progress and needs. Will follow for case management and discharge needs. No cm or discharge needs present at time of this review. Next review date: [id number]  Expected Discharge Date:  (unknown)               Expected Discharge Plan:  Home/Self Care  In-House Referral:     Discharge planning Services  CM Consult  Post Acute Care Choice:    Choice offered to:     DME Arranged:    DME Agency:     HH Arranged:    HH Agency:     Status of Service:  In process, will continue to follow  If discussed at Long Length of Stay Meetings, dates discussed:    Additional Comments:  Golda AcreDavis, Terree Gaultney Lynn, RN 02/16/2017, 6:39 AM

## 2017-02-16 NOTE — Plan of Care (Signed)
  Education: Knowledge of General Education information will improve 02/16/2017 1217 - Progressing by Charlett LangoKotian, Ashiyah Pavlak S, RN   Health Behavior/Discharge Planning: Ability to manage health-related needs will improve 02/16/2017 1217 - Progressing by Charlett LangoKotian, Pretty Weltman S, RN   Clinical Measurements: Ability to maintain clinical measurements within normal limits will improve 02/16/2017 1217 - Progressing by Charlett LangoKotian, Dharma Pare S, RN Will remain free from infection 02/16/2017 1217 - Progressing by Charlett LangoKotian, Tashina Credit S, RN Diagnostic test results will improve 02/16/2017 1217 - Progressing by Charlett LangoKotian, Shawnta Zimbelman S, RN Respiratory complications will improve 02/16/2017 1217 - Not Applicable by Charlett LangoKotian, Yedidya Duddy S, RN Cardiovascular complication will be avoided 02/16/2017 1217 - Progressing by Charlett LangoKotian, Javious Hallisey S, RN

## 2017-02-17 LAB — BASIC METABOLIC PANEL
Anion gap: 7 (ref 5–15)
BUN: 44 mg/dL — ABNORMAL HIGH (ref 6–20)
CO2: 19 mmol/L — ABNORMAL LOW (ref 22–32)
Calcium: 7.7 mg/dL — ABNORMAL LOW (ref 8.9–10.3)
Chloride: 104 mmol/L (ref 101–111)
Creatinine, Ser: 1.63 mg/dL — ABNORMAL HIGH (ref 0.44–1.00)
GFR, EST AFRICAN AMERICAN: 42 mL/min — AB (ref >60)
GFR, EST NON AFRICAN AMERICAN: 37 mL/min — AB (ref >60)
Glucose, Bld: 148 mg/dL — ABNORMAL HIGH (ref 65–99)
POTASSIUM: 3.9 mmol/L (ref 3.5–5.1)
Sodium: 130 mmol/L — ABNORMAL LOW (ref 135–145)

## 2017-02-17 LAB — SURGICAL PCR SCREEN
MRSA, PCR: NEGATIVE
Staphylococcus aureus: NEGATIVE

## 2017-02-17 LAB — HEMOGLOBIN AND HEMATOCRIT, BLOOD
HCT: 21.6 % — ABNORMAL LOW (ref 36.0–46.0)
HCT: 22.6 % — ABNORMAL LOW (ref 36.0–46.0)
HCT: 23.7 % — ABNORMAL LOW (ref 36.0–46.0)
HCT: 25.6 % — ABNORMAL LOW (ref 36.0–46.0)
HEMATOCRIT: 20.6 % — AB (ref 36.0–46.0)
HEMOGLOBIN: 7.1 g/dL — AB (ref 12.0–15.0)
HEMOGLOBIN: 7.4 g/dL — AB (ref 12.0–15.0)
HEMOGLOBIN: 8.7 g/dL — AB (ref 12.0–15.0)
Hemoglobin: 7.5 g/dL — ABNORMAL LOW (ref 12.0–15.0)
Hemoglobin: 8 g/dL — ABNORMAL LOW (ref 12.0–15.0)

## 2017-02-17 LAB — HEPATIC FUNCTION PANEL
ALBUMIN: 1.9 g/dL — AB (ref 3.5–5.0)
ALT: 254 U/L — ABNORMAL HIGH (ref 14–54)
AST: 296 U/L — AB (ref 15–41)
Alkaline Phosphatase: 97 U/L (ref 38–126)
BILIRUBIN TOTAL: 7.7 mg/dL — AB (ref 0.3–1.2)
Bilirubin, Direct: 3.9 mg/dL — ABNORMAL HIGH (ref 0.1–0.5)
Indirect Bilirubin: 3.8 mg/dL — ABNORMAL HIGH (ref 0.3–0.9)
Total Protein: 6.1 g/dL — ABNORMAL LOW (ref 6.5–8.1)

## 2017-02-17 LAB — HEPATITIS PANEL, ACUTE
HCV Ab: >11 s/co ratio — ABNORMAL HIGH (ref 0.0–0.9)
Hep A IgM: NEGATIVE
Hep B C IgM: NEGATIVE
Hepatitis B Surface Ag: NEGATIVE

## 2017-02-17 LAB — CBC
HEMATOCRIT: 22.4 % — AB (ref 36.0–46.0)
HEMOGLOBIN: 7.6 g/dL — AB (ref 12.0–15.0)
MCH: 30.2 pg (ref 26.0–34.0)
MCHC: 33.9 g/dL (ref 30.0–36.0)
MCV: 88.9 fL (ref 78.0–100.0)
Platelets: 173 10*3/uL (ref 150–400)
RBC: 2.52 MIL/uL — ABNORMAL LOW (ref 3.87–5.11)
RDW: 19.1 % — AB (ref 11.5–15.5)
WBC: 10.8 10*3/uL — AB (ref 4.0–10.5)

## 2017-02-17 LAB — PROTIME-INR
INR: 2.04
Prothrombin Time: 22.9 seconds — ABNORMAL HIGH (ref 11.4–15.2)

## 2017-02-17 LAB — GLUCOSE, CAPILLARY
Glucose-Capillary: 131 mg/dL — ABNORMAL HIGH (ref 65–99)
Glucose-Capillary: 151 mg/dL — ABNORMAL HIGH (ref 65–99)

## 2017-02-17 LAB — PREPARE RBC (CROSSMATCH)

## 2017-02-17 MED ORDER — SODIUM CHLORIDE 0.9 % IV SOLN
2.0000 g | Freq: Three times a day (TID) | INTRAVENOUS | Status: DC
  Administered 2017-02-17 – 2017-02-21 (×11): 2 g via INTRAVENOUS
  Filled 2017-02-17 (×15): qty 2

## 2017-02-17 MED ORDER — VITAMIN K1 10 MG/ML IJ SOLN
10.0000 mg | Freq: Once | INTRAVENOUS | Status: AC
  Administered 2017-02-17: 10 mg via INTRAVENOUS
  Filled 2017-02-17: qty 1

## 2017-02-17 MED ORDER — SODIUM CHLORIDE 0.9 % IV SOLN
Freq: Once | INTRAVENOUS | Status: AC
  Administered 2017-02-17: 11:00:00 via INTRAVENOUS

## 2017-02-17 MED ORDER — METHOCARBAMOL 500 MG PO TABS
750.0000 mg | ORAL_TABLET | Freq: Once | ORAL | Status: AC
  Administered 2017-02-17: 750 mg via ORAL
  Filled 2017-02-17: qty 2

## 2017-02-17 NOTE — Progress Notes (Signed)
[facility]  Marinus MawDena Peltzer 47 y.o. 09-Feb-1970  CC:  Variceal bleed   Subjective: Patient seen and examined at bedside. Family at bedside. No acute GI issues. Continues to have pain from fracture. Denied any black stool, bright blood per rectum or hematemesis. Discussed with nursing staff. No reported active GI bleed.  ROS : Positive for joint pain.   Objective: Vital signs in last 24 hours: Vitals:   02/17/17 0700 02/17/17 0800  BP: (!) 106/53 (!) 106/58  Pulse: 75 78  Resp: 10 (!) 7  Temp:  98 F (36.7 C)  SpO2: 98% 99%    Physical Exam:  General:  alert and oriented 3. Not in acute distress Eyes-scleral icterus noted Lungs:Clear to auscultate bilaterally. Anterior exam only. Heart: Regular rate and rhythm; no murmurs, clicks, rubs, or gallops. Abdomen: , nontender, no peritoneal signs, soft, mild distended, bowel sounds present. Skin-Spider angioma anterior chest noted     Lab Results: Recent Labs    02/16/17 0126 02/17/17 0039  NA 129* 130*  K 4.5 3.9  CL 101 104  CO2 21* 19*  GLUCOSE 119* 148*  BUN 53* 44*  CREATININE 1.42* 1.63*  CALCIUM 7.5* 7.7*  MG 2.1  --    Recent Labs    02/16/17 0126 02/17/17 0312  AST 714* 296*  ALT 350* 254*  ALKPHOS 106 97  BILITOT 9.1* 7.7*  PROT 6.3* 6.1*  ALBUMIN 2.0* 1.9*   Recent Labs    02/15/17 0616  02/16/17 0126  02/17/17 0312 02/17/17 0550  WBC 4.7  --  7.7  --  10.8*  --   NEUTROABS 2.8  --   --   --   --   --   HGB 3.2*   < > 7.0*   < > 7.6* 7.5*  HCT 9.8*   < > 20.0*   < > 22.4* 22.6*  MCV 91.6  --  88.1  --  88.9  --   PLT 97*  --  153  --  173  --    < > = values in this interval not displayed.   Recent Labs    02/16/17 0126 02/17/17 0312  LABPROT 23.7* 22.9*  INR 2.13 2.04      Assessment/Plan: - Variceal bleed. EGD yesterday with 4 bands placement. - Severe portal gastropathy particularly in fundus -Symptomatic anemia with hemoglobin of 3.2.  Status post blood transfusion. Hemoglobin improved with blood transfusion -Liver cirrhosis probably from combination of alcohol use and history of hepatitis C.Ongoing alcohol use -Elevated LFTs. Combination of decompensated cirrhosis/alcoholic hepatitis  -Thrombocytopenia -Displaced intertrochanteric right hip fracture  Recommendations ------------------------- - No evidence of further active bleeding. - Continue  octreotide drip for total 72 hrs , continue  antibiotics for now and  IV twice a day - ultrasound showed advanced cirrhosis otherwise no acute changes. -follow HCV PCR with genotype - MELD score 30.  discriminant score 43.1. Based on todays  lab.  -  Not a candidate for prednisone because of active GI bleed -  Long-term prognosis poor because of advance meld score - Not a candidate for liver transplant because of ongoing alcohol use - Absolute alcohol abstinence advised. -GI will follow.      Kathi DerParag Mcgwire Dasaro MD, FACP 02/17/2017, 9:43 AM  Contact #  [phone]

## 2017-02-17 NOTE — Progress Notes (Signed)
Patient c/o pain 10/10 right fractured hip and unable to straighten right leg. Paged Linton FlemingsX. Blount. New order for one time dose 750 mg PO robaxin in Epic and given. Continue to monitor.

## 2017-02-17 NOTE — H&P (View-Only) (Signed)
Discussed plan of care with patient and friend this morning.  She has a oblique typical right intertroch fracture.  Distal motor and sensory preserved.   Plan for IMN as soon as is stable, [facility]17 [facility].  Discussed with medical team that after transfusion if patient is transferred over and has stable numbers we can arrange for surgery tomorrow versus Monday.    The risks benefits and alternatives were discussed with the patient including but not limited to the risks of nonoperative treatment, versus surgical intervention including infection, bleeding, nerve injury, periprosthetic fracture, the need for revision surgery, leg length discrepancy, gait change, blood clots, cardiopulmonary complications, morbidity, mortality, among others, and they were willing to proceed.    Plan for Short gamma nail, WBAT after, defer remainder of medical care to primary team.

## 2017-02-17 NOTE — Progress Notes (Signed)
PROGRESS NOTE    Debbie Barker  ZOX:[id number] DOB: September 30, 1970 DOA: 02/15/2017 PCP: Lavinia Sharps, NP    Brief Narrative:  47 y.o. female with medical history significant of alcoholic cirrhosis complicated by chronic pancreatitis, esophageal varices with prior GI bleeds, ascites, active alcohol abuse, depression who comes in with fall and severe leg pain in the setting of presyncopal episode after several days of hematemesis.   Assessment & Plan:   Principal Problem:   Acute upper GI bleeding - GI consulted - last hemoglobin less than 8.0 will plan on transfusing 1 unit of PRBC. Order placed today. - pt is on octreotide and protonix  Active Problems:   Alcoholic cirrhosis of liver with ascites (HCC)/Secondary esophageal varices with bleeding (HCC) - Gi evaluated patient and pt is s/p banding of esophageal varices. - continue octreotide as recommended by GI (for a total of 72 hours) - continue antibiotics    AKI (acute kidney injury) (HCC) - Will continue to monitor and reassess with bmp next am. - not enough reported values in the chart to know baseline creatinine level  Right hip fracture: in the setting of fall -  Intertrochanteric right hip fracture - supportive therapy - Ortho on board and assisting. Plan for surgical repair. GI would like to scope patient and band any varices that may require banding.    Alcohol abuse - Pt has thiamine and ativan for withdrawal symptoms.    Acute on chronic alcoholic liver disease (HCC) - GI on board  DVT prophylaxis: SCD's Code Status: DNR Family Communication: none at bedside. Disposition Plan: pending improvement in condition   Consultants:   Ortho: Dr. Primus Bravo  GI: [place]t   Procedures: pending   Antimicrobials: Aztreonam   Subjective: Pt has no new complaints, denies any active bleeding.  Objective: Vitals:   02/17/17 1046 02/17/17 1100 02/17/17 1200 02/17/17 1213  BP: (!) 104/45 (!) 107/59 109/62 110/67   Pulse: 85 76 81 82  Resp: 13 11 12 11   Temp: 98.4 F (36.9 C)  98.1 F (36.7 C) 97.8 F (36.6 C)  TempSrc: Oral  Oral Oral  SpO2: 94% 93% 94% 97%  Weight:      Height:        Intake/Output Summary (Last 24 hours) at 02/17/2017 1241 Last data filed at 02/17/2017 1210 Gross per 24 hour  Intake 3834.83 ml  Output 1100 ml  Net 2734.83 ml   Filed Weights   02/15/17 2106 02/16/17 0045  Weight: 72.6 kg (160 lb) 62.3 kg (137 lb 5.6 oz)    Examination:  General exam: Appears calm and comfortable, in nad. Respiratory system: Clear to auscultation. Respiratory effort normal. Cardiovascular system: S1 & S2 heard, RRR. No JVD, murmurs, or rubs Gastrointestinal system: Abdomen is distended, soft and nontender Central nervous system: Alert and oriented. No focal neurological deficits. Extremities: warm and dry Skin: No rashes, lesions or ulcers Psychiatry:  Mood & affect appropriate.     Data Reviewed: I have personally reviewed following labs and imaging studies  CBC: Recent Labs  Lab 02/15/17 0616  02/16/17 0126  02/16/17 2029 02/17/17 0039 02/17/17 0312 02/17/17 0550 02/17/17 1012  WBC 4.7  --  7.7  --   --   --  10.8*  --   --   NEUTROABS 2.8  --   --   --   --   --   --   --   --   HGB 3.2*   < > 7.0*   < >  7.5* 7.4* 7.6* 7.5* 7.1*  HCT 9.8*   < > 20.0*   < > 21.8* 21.6* 22.4* 22.6* 20.6*  MCV 91.6  --  88.1  --   --   --  88.9  --   --   PLT 97*  --  153  --   --   --  173  --   --    < > = values in this interval not displayed.   Basic Metabolic Panel: Recent Labs  Lab 02/15/17 0616 02/15/17 1618 02/16/17 0126 02/17/17 0039  NA 127* 132* 129* 130*  K 2.9* 2.8* 4.5 3.9  CL 94* 110 101 104  CO2 23 19* 21* 19*  GLUCOSE 126* 86 119* 148*  BUN 61* 48* 53* 44*  CREATININE 1.67* 1.21* 1.42* 1.63*  CALCIUM 6.8* 5.6* 7.5* 7.7*  MG  --   --  2.1  --    GFR: Estimated Creatinine Clearance: 36.8 mL/min (A) (by C-G formula based on SCr of 1.63 mg/dL (H)). Liver  Function Tests: Recent Labs  Lab 02/15/17 0616 02/16/17 0126 02/17/17 0312  AST 1,069* 714* 296*  ALT 360* 350* 254*  ALKPHOS 86 106 97  BILITOT 4.1* 9.1* 7.7*  PROT 5.3* 6.3* 6.1*  ALBUMIN 1.7* 2.0* 1.9*   Recent Labs  Lab 02/15/17 0616  LIPASE 73*   Recent Labs  Lab 02/15/17 0644  AMMONIA 26   Coagulation Profile: Recent Labs  Lab 02/15/17 0616 02/16/17 0126 02/17/17 0312  INR 2.65 2.13 2.04   Cardiac Enzymes: No results for input(s): CKTOTAL, CKMB, CKMBINDEX, TROPONINI in the last 168 hours. BNP (last 3 results) No results for input(s): PROBNP in the last 8760 hours. HbA1C: No results for input(s): HGBA1C in the last 72 hours. CBG: Recent Labs  Lab 02/16/17 1146 02/16/17 1606 02/16/17 2147 02/17/17 0756 02/17/17 1204  GLUCAP 136* 156* 231* 131* 151*   Lipid Profile: No results for input(s): CHOL, HDL, LDLCALC, TRIG, CHOLHDL, LDLDIRECT in the last 72 hours. Thyroid Function Tests: No results for input(s): TSH, T4TOTAL, FREET4, T3FREE, THYROIDAB in the last 72 hours. Anemia Panel: No results for input(s): VITAMINB12, FOLATE, FERRITIN, TIBC, IRON, RETICCTPCT in the last 72 hours. Sepsis Labs: Recent Labs  Lab 02/15/17 0709  LATICACIDVEN 1.34    Recent Results (from the past 240 hour(s))  MRSA PCR Screening     Status: None   Collection Time: 02/16/17 12:32 AM  Result Value Ref Range Status   MRSA by PCR NEGATIVE NEGATIVE Final    Comment:        The GeneXpert MRSA Assay (FDA approved for NASAL specimens only), is one component of a comprehensive MRSA colonization surveillance program. It is not intended to diagnose MRSA infection nor to guide or monitor treatment for MRSA infections. Performed at [facility], [address] [place], [place] [address]      Radiology Studies: [place]as Abdomen Complete  Result Date: 02/15/2017 CLINICAL DATA:  Liver failure. EXAM: ABDOMEN ULTRASOUND COMPLETE COMPARISON:  CT scan 02/04/2017  FINDINGS: Gallbladder: Surgically absent. Common bile duct: Diameter: 5.8 mm Liver: Cirrhotic changes involving the liver with confluent hepatic fibrosis and regenerating nodules. No obvious focal hepatic lesion. IVC: Normal caliber. Pancreas: Not visualized. Spleen: Normal size.  No focal lesions. Right Kidney: Length: 11.0 cm. Normal renal cortical thickness and echogenicity without focal lesions or hydronephrosis. Left Kidney: Length: 12.2 cm. Normal renal cortical thickness and echogenicity without focal lesions or hydronephrosis. Abdominal aorta: No aneurysm visualized. Other findings: None.  Portal vein velocities: Proximal 67.6 centimeter/second. Mid: 59.2 centimeter/second. Distal: 50.9 centimeter/second. Hepatopetal flow. Right portal vein: 48 centimeter/second.  Hepatopetal flow. Left portal vein 89 cm per second.  Hepatopetal flow. Hepatic veins: Right: 41.9 centimeter/second.  Hepatofugal flow. Middle: 41.2 cm second.  Hepatofugal flow. Left: 62.2 centimeter/second.  Hepatofugal flow. Intrahepatic IVC: Patent Hepatic artery: 131.6 centimeter/second. Splenic vein: 19.5 centimeter/second. Splenic size: 12.4 x 5.3 x 4.2 cm.  Volume is 144 cubic cm. No portal vein occlusion or thrombus No splenic vein occlusion or thrombus Ascites is present. No obvious varices. IMPRESSION: 1. Advanced cirrhotic changes involving the liver but no focal hepatic lesion. 2. Status post cholecystectomy.  No biliary dilatation. 3. Normal size spleen. 4. Normal hepatopetal flow in the portal vein. No occlusion or thrombus. Electronically Signed   By: Rudie Meyer M.D.   On: 02/15/2017 21:17   US Liver Doppler  Result Date: 02/15/2017 CLINICAL DATA:  Liver failure. EXAM: ABDOMEN ULTRASOUND COMPLETE COMPARISON:  CT scan 02/04/2017 FINDINGS: Gallbladder: Surgically absent. Common bile duct: Diameter: 5.8 mm Liver: Cirrhotic changes involving the liver with confluent hepatic fibrosis and regenerating nodules. No obvious focal  hepatic lesion. IVC: Normal caliber. Pancreas: Not visualized. Spleen: Normal size.  No focal lesions. Right Kidney: Length: 11.0 cm. Normal renal cortical thickness and echogenicity without focal lesions or hydronephrosis. Left Kidney: Length: 12.2 cm. Normal renal cortical thickness and echogenicity without focal lesions or hydronephrosis. Abdominal aorta: No aneurysm visualized. Other findings: None. Portal vein velocities: Proximal 67.6 centimeter/second. Mid: 59.2 centimeter/second. Distal: 50.9 centimeter/second. Hepatopetal flow. Right portal vein: 48 centimeter/second.  Hepatopetal flow. Left portal vein 89 cm per second.  Hepatopetal flow. Hepatic veins: Right: 41.9 centimeter/second.  Hepatofugal flow. Middle: 41.2 cm second.  Hepatofugal flow. Left: 62.2 centimeter/second.  Hepatofugal flow. Intrahepatic IVC: Patent Hepatic artery: 131.6 centimeter/second. Splenic vein: 19.5 centimeter/second. Splenic size: 12.4 x 5.3 x 4.2 cm.  Volume is 144 cubic cm. No portal vein occlusion or thrombus No splenic vein occlusion or thrombus Ascites is present. No obvious varices. IMPRESSION: 1. Advanced cirrhotic changes involving the liver but no focal hepatic lesion. 2. Status post cholecystectomy.  No biliary dilatation. 3. Normal size spleen. 4. Normal hepatopetal flow in the portal vein. No occlusion or thrombus. Electronically Signed   By: Rudie Meyer M.D.   On: 02/15/2017 21:17   Scheduled Meds: . folic acid  1 mg Oral Daily  . mouth rinse  15 mL Mouth Rinse BID  . multivitamin with minerals  1 tablet Oral Daily  . pantoprazole (PROTONIX) IV  40 mg Intravenous Q12H  . sodium chloride flush  3 mL Intravenous Q12H  . thiamine  100 mg Oral Daily   Or  . thiamine  100 mg Intravenous Daily   Continuous Infusions: . sodium chloride 100 mL/hr at 02/17/17 1227  . aztreonam Stopped (02/17/17 1023)  . octreotide  (SANDOSTATIN)    IV infusion 50 mcg/hr (02/17/17 1227)  . phytonadione (VITAMIN K) IV 10 mg  (02/17/17 1239)     LOS: 2 days   Time spent: > 35 minutes  Penny Pia, MD [facility] Pager [phone]  If 7PM-7AM, please contact night-coverage [email or web address] Password [facility] 02/17/2017, 12:41 PM

## 2017-02-17 NOTE — Progress Notes (Signed)
Discussed plan of care with patient and friend this morning.  She has a oblique typical right intertroch fracture.  Distal motor and sensory preserved.   Plan for IMN as soon as is stable, tentatively 2/17 at [place].  Discussed with medical team that after transfusion if patient is transferred over and has stable numbers we can arrange for surgery tomorrow versus Monday.    The risks benefits and alternatives were discussed with the patient including but not limited to the risks of nonoperative treatment, versus surgical intervention including infection, bleeding, nerve injury, periprosthetic fracture, the need for revision surgery, leg length discrepancy, gait change, blood clots, cardiopulmonary complications, morbidity, mortality, among others, and they were willing to proceed.    Plan for Short gamma nail, WBAT after, defer remainder of medical care to primary team.    

## 2017-02-18 ENCOUNTER — Inpatient Hospital Stay (HOSPITAL_COMMUNITY): Payer: Medicaid Other

## 2017-02-18 ENCOUNTER — Inpatient Hospital Stay (HOSPITAL_COMMUNITY): Payer: Medicaid Other | Admitting: Anesthesiology

## 2017-02-18 ENCOUNTER — Encounter (HOSPITAL_COMMUNITY)
Admission: EM | Disposition: A | Discharge status: Hospice | Payer: Self-pay | Source: Home / Self Care | Attending: Family Medicine

## 2017-02-18 ENCOUNTER — Encounter (HOSPITAL_COMMUNITY): Payer: Self-pay

## 2017-02-18 HISTORY — PX: INTRAMEDULLARY (IM) NAIL INTERTROCHANTERIC: SHX5875

## 2017-02-18 LAB — TYPE AND SCREEN
ABO/RH(D): AB POS
Antibody Screen: NEGATIVE
Unit division: 0
Unit division: 0
Unit division: 0
Unit division: 0

## 2017-02-18 LAB — CBC WITH DIFFERENTIAL/PLATELET
BASOS PCT: 0 %
Basophils Absolute: 0 10*3/uL (ref 0.0–0.1)
EOS ABS: 0.1 10*3/uL (ref 0.0–0.7)
Eosinophils Relative: 1 %
HEMATOCRIT: 28.2 % — AB (ref 36.0–46.0)
HEMOGLOBIN: 9.2 g/dL — AB (ref 12.0–15.0)
Lymphocytes Relative: 17 %
Lymphs Abs: 1.4 10*3/uL (ref 0.7–4.0)
MCH: 29.1 pg (ref 26.0–34.0)
MCHC: 32.6 g/dL (ref 30.0–36.0)
MCV: 89.2 fL (ref 78.0–100.0)
Monocytes Absolute: 1.2 10*3/uL — ABNORMAL HIGH (ref 0.1–1.0)
Monocytes Relative: 14 %
NEUTROS ABS: 5.5 10*3/uL (ref 1.7–7.7)
NEUTROS PCT: 68 %
Platelets: 109 10*3/uL — ABNORMAL LOW (ref 150–400)
RBC: 3.16 MIL/uL — ABNORMAL LOW (ref 3.87–5.11)
RDW: 19.2 % — ABNORMAL HIGH (ref 11.5–15.5)
WBC: 8.2 10*3/uL (ref 4.0–10.5)

## 2017-02-18 LAB — BPAM RBC
BLOOD PRODUCT EXPIRATION DATE: 201902222359
BLOOD PRODUCT EXPIRATION DATE: 201903062359
Blood Product Expiration Date: 201903042359
Blood Product Expiration Date: 201903042359
ISSUE DATE / TIME: 201902141948
ISSUE DATE / TIME: 201902161015
ISSUE DATE / TIME: 201902140817
ISSUE DATE / TIME: 201902151152
Unit Type and Rh: 6200
Unit Type and Rh: 6200
Unit Type and Rh: 6200
Unit Type and Rh: 6200

## 2017-02-18 LAB — COMPREHENSIVE METABOLIC PANEL
ALBUMIN: 1.9 g/dL — AB (ref 3.5–5.0)
ALK PHOS: 100 U/L (ref 38–126)
ALT: 190 U/L — ABNORMAL HIGH (ref 14–54)
AST: 162 U/L — ABNORMAL HIGH (ref 15–41)
Anion gap: 8 (ref 5–15)
BILIRUBIN TOTAL: 6.4 mg/dL — AB (ref 0.3–1.2)
BUN: 23 mg/dL — ABNORMAL HIGH (ref 6–20)
CO2: 18 mmol/L — AB (ref 22–32)
Calcium: 7.8 mg/dL — ABNORMAL LOW (ref 8.9–10.3)
Chloride: 107 mmol/L (ref 101–111)
Creatinine, Ser: 1.25 mg/dL — ABNORMAL HIGH (ref 0.44–1.00)
GFR calc Af Amer: 58 mL/min — ABNORMAL LOW (ref >60)
GFR calc non Af Amer: 50 mL/min — ABNORMAL LOW (ref >60)
GLUCOSE: 106 mg/dL — AB (ref 65–99)
POTASSIUM: 4.2 mmol/L (ref 3.5–5.1)
SODIUM: 133 mmol/L — AB (ref 135–145)
TOTAL PROTEIN: 6.2 g/dL — AB (ref 6.5–8.1)

## 2017-02-18 LAB — PROTIME-INR
INR: 1.93
Prothrombin Time: 21.9 seconds — ABNORMAL HIGH (ref 11.4–15.2)

## 2017-02-18 LAB — HEMOGLOBIN: Hemoglobin: 8.4 g/dL — ABNORMAL LOW (ref 12.0–15.0)

## 2017-02-18 LAB — MAGNESIUM: Magnesium: 1.4 mg/dL — ABNORMAL LOW (ref 1.7–2.4)

## 2017-02-18 SURGERY — FIXATION, FRACTURE, INTERTROCHANTERIC, WITH INTRAMEDULLARY ROD
Anesthesia: General | Site: Leg Upper | Laterality: Right

## 2017-02-18 MED ORDER — FENTANYL CITRATE (PF) 100 MCG/2ML IJ SOLN
25.0000 ug | INTRAMUSCULAR | Status: DC | PRN
  Administered 2017-02-18: 50 ug via INTRAVENOUS

## 2017-02-18 MED ORDER — SUCCINYLCHOLINE CHLORIDE 200 MG/10ML IV SOSY
PREFILLED_SYRINGE | INTRAVENOUS | Status: DC | PRN
  Administered 2017-02-18: 100 mg via INTRAVENOUS

## 2017-02-18 MED ORDER — MORPHINE SULFATE (PF) 2 MG/ML IV SOLN
0.5000 mg | INTRAVENOUS | Status: DC | PRN
  Administered 2017-02-19 – 2017-02-21 (×4): 0.5 mg via INTRAVENOUS
  Filled 2017-02-18 (×4): qty 1

## 2017-02-18 MED ORDER — ACETAMINOPHEN 500 MG PO TABS
1000.0000 mg | ORAL_TABLET | Freq: Three times a day (TID) | ORAL | Status: AC
  Administered 2017-02-18: 1000 mg via ORAL

## 2017-02-18 MED ORDER — PHENYLEPHRINE HCL 10 MG/ML IJ SOLN
INTRAMUSCULAR | Status: DC | PRN
  Administered 2017-02-18: 15 ug/min via INTRAVENOUS

## 2017-02-18 MED ORDER — MIDAZOLAM HCL 2 MG/2ML IJ SOLN
INTRAMUSCULAR | Status: AC
  Filled 2017-02-18: qty 2

## 2017-02-18 MED ORDER — LIDOCAINE 2% (20 MG/ML) 5 ML SYRINGE
INTRAMUSCULAR | Status: DC | PRN
  Administered 2017-02-18: 60 mg via INTRAVENOUS

## 2017-02-18 MED ORDER — MAGNESIUM SULFATE 2 GM/50ML IV SOLN
2.0000 g | Freq: Once | INTRAVENOUS | Status: AC
  Administered 2017-02-18: 2 g via INTRAVENOUS
  Filled 2017-02-18: qty 50

## 2017-02-18 MED ORDER — LACTATED RINGERS IV SOLN
INTRAVENOUS | Status: DC
  Administered 2017-02-18: 14:00:00 via INTRAVENOUS

## 2017-02-18 MED ORDER — 0.9 % SODIUM CHLORIDE (POUR BTL) OPTIME
TOPICAL | Status: DC | PRN
  Administered 2017-02-18: 1000 mL

## 2017-02-18 MED ORDER — CLINDAMYCIN PHOSPHATE 900 MG/50ML IV SOLN
INTRAVENOUS | Status: AC
  Filled 2017-02-18: qty 50

## 2017-02-18 MED ORDER — ACETAMINOPHEN 500 MG PO TABS
ORAL_TABLET | ORAL | Status: AC
  Administered 2017-02-18: 1000 mg via ORAL
  Filled 2017-02-18: qty 2

## 2017-02-18 MED ORDER — FENTANYL CITRATE (PF) 100 MCG/2ML IJ SOLN
INTRAMUSCULAR | Status: AC
  Administered 2017-02-18: 50 ug via INTRAVENOUS
  Filled 2017-02-18: qty 2

## 2017-02-18 MED ORDER — PHENYLEPHRINE HCL 10 MG/ML IJ SOLN
INTRAMUSCULAR | Status: DC | PRN
  Administered 2017-02-18: 80 ug via INTRAVENOUS

## 2017-02-18 MED ORDER — FENTANYL CITRATE (PF) 250 MCG/5ML IJ SOLN
INTRAMUSCULAR | Status: AC
  Filled 2017-02-18: qty 5

## 2017-02-18 MED ORDER — PROPOFOL 10 MG/ML IV BOLUS
INTRAVENOUS | Status: DC | PRN
  Administered 2017-02-18: 100 mg via INTRAVENOUS

## 2017-02-18 MED ORDER — OXYCODONE HCL 5 MG PO TABS
5.0000 mg | ORAL_TABLET | ORAL | Status: DC | PRN
  Administered 2017-02-19: 10 mg via ORAL
  Administered 2017-02-19: 5 mg via ORAL
  Administered 2017-02-20: 10 mg via ORAL
  Administered 2017-02-20: 5 mg via ORAL
  Administered 2017-02-20 (×3): 10 mg via ORAL
  Administered 2017-02-21: 5 mg via ORAL
  Administered 2017-02-21 – 2017-02-25 (×19): 10 mg via ORAL
  Filled 2017-02-18 (×6): qty 2
  Filled 2017-02-18: qty 1
  Filled 2017-02-18 (×15): qty 2
  Filled 2017-02-18: qty 1
  Filled 2017-02-18: qty 2
  Filled 2017-02-18 (×2): qty 1
  Filled 2017-02-18: qty 2
  Filled 2017-02-18: qty 1
  Filled 2017-02-18: qty 2

## 2017-02-18 MED ORDER — CLINDAMYCIN PHOSPHATE 600 MG/50ML IV SOLN
600.0000 mg | Freq: Four times a day (QID) | INTRAVENOUS | Status: AC
  Administered 2017-02-18 – 2017-02-19 (×2): 600 mg via INTRAVENOUS
  Filled 2017-02-18 (×2): qty 50

## 2017-02-18 MED ORDER — LACTATED RINGERS IV SOLN
INTRAVENOUS | Status: DC | PRN
  Administered 2017-02-18: 13:00:00 via INTRAVENOUS

## 2017-02-18 MED ORDER — ONDANSETRON HCL 4 MG/2ML IJ SOLN
INTRAMUSCULAR | Status: DC | PRN
  Administered 2017-02-18: 4 mg via INTRAVENOUS

## 2017-02-18 MED ORDER — FENTANYL CITRATE (PF) 250 MCG/5ML IJ SOLN
INTRAMUSCULAR | Status: DC | PRN
  Administered 2017-02-18 (×2): 50 ug via INTRAVENOUS

## 2017-02-18 MED ORDER — VITAMIN K1 10 MG/ML IJ SOLN
10.0000 mg | Freq: Once | INTRAMUSCULAR | Status: AC
  Administered 2017-02-18: 10 mg via INTRAVENOUS
  Filled 2017-02-18: qty 1

## 2017-02-18 MED ORDER — CLINDAMYCIN PHOSPHATE 900 MG/50ML IV SOLN
INTRAVENOUS | Status: DC | PRN
  Administered 2017-02-18: 900 mg via INTRAVENOUS

## 2017-02-18 SURGICAL SUPPLY — 44 items
BIT DRILL AO GAMMA 4.2X300 (BIT) ×3 IMPLANT
BNDG COHESIVE 4X5 TAN STRL (GAUZE/BANDAGES/DRESSINGS) ×3 IMPLANT
BNDG GAUZE ELAST 4 BULKY (GAUZE/BANDAGES/DRESSINGS) ×3 IMPLANT
CLOSURE STERI-STRIP 1/2X4 (GAUZE/BANDAGES/DRESSINGS) ×1
CLOSURE WOUND 1/2 X4 (GAUZE/BANDAGES/DRESSINGS)
CLSR STERI-STRIP ANTIMIC 1/2X4 (GAUZE/BANDAGES/DRESSINGS) ×2 IMPLANT
COVER PERINEAL POST (MISCELLANEOUS) ×3 IMPLANT
COVER SURGICAL LIGHT HANDLE (MISCELLANEOUS) ×3 IMPLANT
DRAPE STERI IOBAN 125X83 (DRAPES) ×3 IMPLANT
DRSG MEPILEX BORDER 4X4 (GAUZE/BANDAGES/DRESSINGS) ×9 IMPLANT
DURAPREP 26ML APPLICATOR (WOUND CARE) ×3 IMPLANT
ELECT REM PT RETURN 9FT ADLT (ELECTROSURGICAL) ×3
ELECTRODE REM PT RTRN 9FT ADLT (ELECTROSURGICAL) ×1 IMPLANT
GLOVE BIOGEL PI IND STRL 8 (GLOVE) ×1 IMPLANT
GLOVE BIOGEL PI INDICATOR 8 (GLOVE) ×2
GLOVE ECLIPSE 8.0 STRL XLNG CF (GLOVE) ×6 IMPLANT
GOWN STRL REUS W/ TWL LRG LVL3 (GOWN DISPOSABLE) ×3 IMPLANT
GOWN STRL REUS W/TWL LRG LVL3 (GOWN DISPOSABLE) ×6
GUIDEROD T2 3X1000 (ROD) ×3 IMPLANT
GUIDEWIRE GAMMA (WIRE) ×3 IMPLANT
K-WIRE  3.2X450M STR (WIRE) ×2
K-WIRE 3.2X450M STR (WIRE) ×1
KIT ROOM TURNOVER OR (KITS) ×3 IMPLANT
KWIRE 3.2X450M STR (WIRE) ×1 IMPLANT
MANIFOLD NEPTUNE II (INSTRUMENTS) IMPLANT
NAIL TROCH GAMMA 11X18 (Nail) ×3 IMPLANT
NS IRRIG 1000ML POUR BTL (IV SOLUTION) ×3 IMPLANT
PACK GENERAL/GYN (CUSTOM PROCEDURE TRAY) ×3 IMPLANT
PAD ARMBOARD 7.5X6 YLW CONV (MISCELLANEOUS) ×3 IMPLANT
PAD CAST 4YDX4 CTTN HI CHSV (CAST SUPPLIES) IMPLANT
PADDING CAST COTTON 4X4 STRL (CAST SUPPLIES)
REAMER SHAFT BIXCUT (INSTRUMENTS) ×3 IMPLANT
SCREW LAG GAMMA 3 TI 10.5X85MM (Screw) ×3 IMPLANT
SCREW LOCKING T2 F/T  5X32.5MM (Screw) ×2 IMPLANT
SCREW LOCKING T2 F/T 5X32.5MM (Screw) ×1 IMPLANT
STRIP CLOSURE SKIN 1/2X4 (GAUZE/BANDAGES/DRESSINGS) IMPLANT
SUT MNCRL AB 4-0 PS2 18 (SUTURE) IMPLANT
SUT MON AB 2-0 CT1 27 (SUTURE) ×3 IMPLANT
SUT VIC AB 0 CT1 27 (SUTURE) ×2
SUT VIC AB 0 CT1 27XBRD ANBCTR (SUTURE) ×1 IMPLANT
SUT VIC AB 3-0 CT1 27 (SUTURE) ×2
SUT VIC AB 3-0 CT1 TAPERPNT 27 (SUTURE) ×1 IMPLANT
TOWEL OR 17X24 6PK STRL BLUE (TOWEL DISPOSABLE) ×3 IMPLANT
TOWEL OR 17X26 10 PK STRL BLUE (TOWEL DISPOSABLE) ×3 IMPLANT

## 2017-02-18 NOTE — Progress Notes (Signed)
Patient ID: Debbie Barker, female   DOB: [birth year], 47 y.o.   MRN: [id number]  PROGRESS NOTE    Markie Heffernan  UVO:[id number] DOB: [birth year] DOA: 02/15/2017 PCP: Lavinia Sharps, NP   Brief Narrative:  47 year old female with history of alcoholic cirrhosis, esophageal varices with prior GI bleeds, ascites, chronic pancreatitis, active alcohol abuse, depression presented on 02/15/2017 with fall and severe leg pain in the setting of presyncopal episode after several days of hematemesis.  She was found to have intertrochanteric right hip fracture.  Orthopedics was consulted.  She was initially found to have hemoglobin of 3.2 for which she was given blood transfusion.  She was started on octreotide and Protonix.  GI was consulted.  She underwent EGD on 02/16/2017 with 4 bands placement.  Patient was transferred to [facility] for further orthopedic intervention.   Assessment & Plan:   Principal Problem:   Acute upper GI bleeding Active Problems:   Alcoholic cirrhosis of liver with ascites (HCC)   Secondary esophageal varices with bleeding (HCC)   AKI (acute kidney injury) (HCC)   Alcohol abuse   Acute on chronic alcoholic liver disease (HCC)   Acute upper GI bleed   Acute blood loss anemia probably from upper GI bleeding -Hemoglobin stable this morning.  Status post 4 units of packed red cells transfusion since admission -Monitor H&H.  No overnight hematemesis or melena  Upper GI bleeding probably secondary to bleeding esophageal varices status post banding -Continue octreotide and Protonix.  GI following  Decompensated cirrhosis of liver probably due to combination of alcohol use and history of hepatitis C with ascites and elevated LFTs -GI following.  Continue antibiotics empirically. -Not a candidate for liver transplant because of ongoing alcohol abuse -Trend LFTs  Thrombocytopenia -Probably secondary to decompensated cirrhosis of liver -Monitor  Right hip intertrochanteric  fracture status post fall -Orthopedics following.  Probable plan for surgical intervention today. -Fall precautions  Alcohol abuse -Continue CIWA protocol.  Continue thiamine, folate and multivitamin  Hypomagnesemia -Replace.  Repeat a.m. labs  DVT prophylaxis: SCDs Code Status: DNR Family Communication: Spoke to friend at bedside Disposition Plan: Depends on clinical outcome  Consultants: Orthopedics and GI  Procedures:  EGD on 02/16/2017 showed large esophageal varices with stigmata of recent bleeding. 4 bands placed. 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  Antimicrobials:  Azactam from 02/15/2017 onwards  Subjective: Seen and examined at bedside.  She is sleepy, wakes up on calling her name and answers some questions.  Poor historian.  Apparently had a few loose bowel movements overnight.  No overnight fever or vomiting  Objective: Vitals:   02/17/17 1728 02/17/17 1858 02/17/17 2121 02/18/17 0546  BP: (!) 117/51 (!) 114/57 (!) 114/59 111/69  Pulse: 87 89 80 92  Resp:  16 15 16   Temp:  98.2 F (36.8 C) 98.2 F (36.8 C) 98.8 F (37.1 C)  TempSrc:  Oral Oral Oral  SpO2:  97% 95% 95%  Weight:  69.8 kg (153 lb 14.1 oz)    Height:  5\' 8"  (1.727 m)      Intake/Output Summary (Last 24 hours) at 02/18/2017 1415 Last data filed at 02/18/2017 1059 Gross per 24 hour  Intake 691.74 ml  Output -  Net 691.74 ml   Filed Weights   02/15/17 2106 02/16/17 0045 02/17/17 1858  Weight: 72.6 kg (160 lb) 62.3 kg (137 lb 5.6 oz) 69.8 kg (153 lb 14.1 oz)    Examination:  General exam: Appears calm and  comfortable.  No distress.  Looks older than stated age Respiratory system: Bilateral decreased breath sound at bases Cardiovascular system: S1 & S2 heard, controlled.  Gastrointestinal system: Abdomen is distended, soft and nontender. Normal bowel sounds heard. Central nervous system: Sleepy, wakes up on calling her name. No focal neurological deficits. Moving  extremities Extremities: No cyanosis, clubbing, edema  Skin: No rashes, lesions or ulcers Lymph: No cervical lymphadenopathy  Data Reviewed: I have personally reviewed following labs and imaging studies  CBC: Recent Labs  Lab 02/15/17 0616  02/16/17 0126  02/17/17 0312 02/17/17 0550 02/17/17 1012 02/17/17 1348 02/17/17 1747 02/18/17 0749 02/18/17 1031  WBC 4.7  --  7.7  --  10.8*  --   --   --   --   --  8.2  NEUTROABS 2.8  --   --   --   --   --   --   --   --   --  5.5  HGB 3.2*   < > 7.0*   < > 7.6* 7.5* 7.1* 8.7* 8.0* 8.4* 9.2*  HCT 9.8*   < > 20.0*   < > 22.4* 22.6* 20.6* 25.6* 23.7*  --  28.2*  MCV 91.6  --  88.1  --  88.9  --   --   --   --   --  89.2  PLT 97*  --  153  --  173  --   --   --   --   --  109*   < > = values in this interval not displayed.   Basic Metabolic Panel: Recent Labs  Lab 02/15/17 0616 02/15/17 1618 02/16/17 0126 02/17/17 0039 02/18/17 1031  NA 127* 132* 129* 130* 133*  K 2.9* 2.8* 4.5 3.9 4.2  CL 94* 110 101 104 107  CO2 23 19* 21* 19* 18*  GLUCOSE 126* 86 119* 148* 106*  BUN 61* 48* 53* 44* 23*  CREATININE 1.67* 1.21* 1.42* 1.63* 1.25*  CALCIUM 6.8* 5.6* 7.5* 7.7* 7.8*  MG  --   --  2.1  --  1.4*   GFR: Estimated Creatinine Clearance: 56.1 mL/min (A) (by C-G formula based on SCr of 1.25 mg/dL (H)). Liver Function Tests: Recent Labs  Lab 02/15/17 0616 02/16/17 0126 02/17/17 0312 02/18/17 1031  AST 1,069* 714* 296* 162*  ALT 360* 350* 254* 190*  ALKPHOS 86 106 97 100  BILITOT 4.1* 9.1* 7.7* 6.4*  PROT 5.3* 6.3* 6.1* 6.2*  ALBUMIN 1.7* 2.0* 1.9* 1.9*   Recent Labs  Lab 02/15/17 0616  LIPASE 73*   Recent Labs  Lab 02/15/17 0644  AMMONIA 26   Coagulation Profile: Recent Labs  Lab 02/15/17 0616 02/16/17 0126 02/17/17 0312 02/18/17 1031  INR 2.65 2.13 2.04 1.93   Cardiac Enzymes: No results for input(s): CKTOTAL, CKMB, CKMBINDEX, TROPONINI in the last 168 hours. BNP (last 3 results) No results for input(s):  PROBNP in the last 8760 hours. HbA1C: No results for input(s): HGBA1C in the last 72 hours. CBG: Recent Labs  Lab 02/16/17 1146 02/16/17 1606 02/16/17 2147 02/17/17 0756 02/17/17 1204  GLUCAP 136* 156* 231* 131* 151*   Lipid Profile: No results for input(s): CHOL, HDL, LDLCALC, TRIG, CHOLHDL, LDLDIRECT in the last 72 hours. Thyroid Function Tests: No results for input(s): TSH, T4TOTAL, FREET4, T3FREE, THYROIDAB in the last 72 hours. Anemia Panel: No results for input(s): VITAMINB12, FOLATE, FERRITIN, TIBC, IRON, RETICCTPCT in the last 72 hours. Sepsis Labs: Recent Labs  Lab 02/15/17 [phone]  LATICACIDVEN 1.34    Recent Results (from the past 240 hour(s))  MRSA PCR Screening     Status: None   Collection Time: 02/16/17 12:32 AM  Result Value Ref Range Status   MRSA by PCR NEGATIVE NEGATIVE Final    Comment:        The GeneXpert MRSA Assay (FDA approved for NASAL specimens only), is one component of a comprehensive MRSA colonization surveillance program. It is not intended to diagnose MRSA infection nor to guide or monitor treatment for MRSA infections. Performed at [facility], [address] [place], [place] [address]   Surgical pcr screen     Status: None   Collection Time: 02/17/17  5:41 AM  Result Value Ref Range Status   MRSA, PCR NEGATIVE NEGATIVE Final   Staphylococcus aureus NEGATIVE NEGATIVE Final    Comment: (NOTE) The Xpert SA Assay (FDA approved for NASAL specimens in patients 48 years of age and older), is one component of a comprehensive surveillance program. It is not intended to diagnose infection nor to guide or monitor treatment. Performed at [facility], [address] [place], [place] [address]          Radiology Studies: No results found.      Scheduled Meds: . [MAR Hold] folic acid  1 mg Oral Daily  . [MAR Hold] mouth rinse  15 mL Mouth Rinse BID  . [MAR Hold] multivitamin with minerals   1 tablet Oral Daily  . [MAR Hold] pantoprazole (PROTONIX) IV  40 mg Intravenous Q12H  . [MAR Hold] sodium chloride flush  3 mL Intravenous Q12H  . [MAR Hold] thiamine  100 mg Oral Daily   Or  . [MAR Hold] thiamine  100 mg Intravenous Daily   Continuous Infusions: . [MAR Hold] aztreonam Stopped (02/18/17 0603)  . lactated ringers 10 mL/hr at 02/18/17 1332  . octreotide  (SANDOSTATIN)    IV infusion 50 mcg/hr (02/18/17 0935)     LOS: 3 days        Glade LloydKshitiz Thereasa Iannello, MD [facility] Pager [phone]  If 7PM-7AM, please contact night-coverage [email or web address] Password [email or web address] 02/18/2017, 2:15 PM

## 2017-02-18 NOTE — Progress Notes (Signed)
[facility] Progress Note  Debbie Barker 47 y.o. [birth year]  CC:  Variceal bleed   Subjective: Patient was transferred to [facility] for orthopedic intervention. Patient seen and examined at bedside. Family at bedside.Discussed with nursing staff. No reported active GI bleed. Had  2-3 small bowel movements with loose stools overnight.Continues to have pain from fracture. Denied any black stool, bright blood per rectum or hematemesis.   ROS : Positive for joint pain. Negative for chest pain.   Objective: Vital signs in last 24 hours: Vitals:   02/17/17 2121 02/18/17 0546  BP: (!) 114/59 111/69  Pulse: 80 92  Resp: 15 16  Temp: 98.2 F (36.8 C) 98.8 F (37.1 C)  SpO2: 95% 95%    Physical Exam:  General:  alert and oriented 3. Not in acute distress Eyes-scleral icterus noted Lungs:Clear to auscultate bilaterally. Anterior exam only. Heart: Regular rate and rhythm; no murmurs, clicks, rubs, or gallops. Abdomen: , nontender, no peritoneal signs, soft, mild distended, bowel sounds present. Skin-Spider angioma anterior chest noted     Lab Results: Recent Labs    02/16/17 0126 02/17/17 0039  NA 129* 130*  K 4.5 3.9  CL 101 104  CO2 21* 19*  GLUCOSE 119* 148*  BUN 53* 44*  CREATININE 1.42* 1.63*  CALCIUM 7.5* 7.7*  MG 2.1  --    Recent Labs    02/16/17 0126 02/17/17 0312  AST 714* 296*  ALT 350* 254*  ALKPHOS 106 97  BILITOT 9.1* 7.7*  PROT 6.3* 6.1*  ALBUMIN 2.0* 1.9*   Recent Labs    02/16/17 0126  02/17/17 0312  02/17/17 1348 02/17/17 1747 02/18/17 0749  WBC 7.7  --  10.8*  --   --   --   --   HGB 7.0*   < > 7.6*   < > 8.7* 8.0* 8.4*  HCT 20.0*   < > 22.4*   < > 25.6* 23.7*  --   MCV 88.1  --  88.9  --   --   --   --   PLT 153  --  173  --   --   --   --    < > = values in this interval not displayed.   Recent Labs    02/16/17 0126 02/17/17 0312  LABPROT 23.7* 22.9*  INR 2.13 2.04      Assessment/Plan: - Variceal  bleed. EGD 02/16/2017 with 4 bands placement. - Severe portal gastropathy particularly in fundus -Symptomatic anemia with hemoglobin of 3.2 on admission . Status post blood transfusion. Hemoglobin improved with blood transfusion and stable now -Liver cirrhosis probably from combination of alcohol use and history of hepatitis C.Ongoing alcohol use -Elevated LFTs. Combination of decompensated cirrhosis/alcoholic hepatitis  -Displaced intertrochanteric right hip fracture  Recommendations ------------------------- - No evidence of further active bleeding. Hemoglobin relatively stable.  - Okay to proceed with orthopedic intervention from GI standpoint. Given recent variceal bleed , advance cirrhosis (MELD 30) and mildly elevated INR, patient remains at higher  risk for recurrent GI bleed after starting anticoagulation. If anticoagulation is absolutely necessary, consider starting heparin drip at low dose first with close monitoring of hemoglobin.   - Continue  octreotide drip for another 24 hours., continue  antibiotics for now and  IV twice a day - Vit K 10 mg IV  - ultrasound showed advanced cirrhosis otherwise no acute changes. -follow HCV PCR with genotype - MELD score 30.  discriminant score 43.1. Based on 02/17/2017 labs.  -  Not a candidate for prednisone because of active GI bleed -  Long-term prognosis poor because of advance meld score - Not a candidate for liver transplant because of ongoing alcohol use - Absolute alcohol abstinence advised. -GI will follow.      Kathi DerParag Talma Aguillard MD, FACP 02/18/2017, 9:45 AM  Contact #  [phone]

## 2017-02-18 NOTE — Progress Notes (Signed)
Report received from [place], [place].

## 2017-02-18 NOTE — Op Note (Signed)
Orthopaedic Surgery Operative Note (CSN: [id number])  Debbie Barker  1970/05/31 Date of Surgery: 02/15/2017 - 02/18/2017   Diagnoses:  Right intertrochanteric femur fracture  Procedure: 27245 - Short gamma nail r femur   Operative Finding Successful completion of planned procedure.  Short nail with good purchase.  Good hemostasis at end of case.  Short nail used to avoid instrumenting entire femur in patient with reasonable bone quality and coagulopathy and anemia to avoid increased blood loss.  Post-operative plan: The patient will be wbat.  The patient will be readmitted to medicine.  DVT prophylaxis not indicated as patient INR 2 at baseline due to coagulopathy.  Pain control with PRN pain medication preferring oral medicines.  Follow up plan will be scheduled in approximately 10-14 days for wound check and xr.  Post-Op Diagnosis: Same Surgeons:Primary: Bjorn Pippin, MD Assistants:Brandon [facility] Location: [facility] OR ROOM 06 Anesthesia: General Antibiotics: Ancef 2g preop Tourniquet time: * No tourniquets in log * Estimated Blood Loss: 100 Complications: None Specimens: None Implants: Implant Name Type Inv. Item Serial No. Manufacturer Lot No. LRB No. Used Action  NAIL [facility] GAMMA 11X18 - [id number] Nail NAIL TROCH GAMMA 11X18  STRYKER TRAUMA [id number] Right 1 Implanted  SCREW LAG GAMMA 3 TI 10.5X85MM - [id number] Screw SCREW LAG GAMMA 3 TI 10.5X85MM  STRYKER TRAUMA [id number] Right 1 Implanted  SCREW LOCKING T2 F/T  5X32.5MM - [id number] Screw SCREW LOCKING T2 F/T  5X32.5MM  STRYKER TRAUMA [id number] Right 1 Implanted    Indications for Surgery:   Debbie Barker is a 47 y.o. female with fall in setting of GI bleed and R hip fracture.   Care was delayed getting patient to OR due to profound anemia with hemoglobin of 3 at presentation.  Esophageal varices handled by GI and resuscitated by medicine.  We talked about a short gamma nail to theoretically avoid instrumenting the entire femur and increasing  blood loss potential.  Benefits and risks of operative and nonoperative management were discussed prior to surgery with patient/guardian(s) and informed consent form was completed.  Specific risks including infection, need for additional surgery, periprosthetic fracture, loss of fixation, hardware pain.   Procedure:   The patient was identified in the preoperative holding area where the surgical site was marked. The patient was taken to the OR where a procedural timeout was called and the above noted anesthesia was induced.  The patient was positioned supine on fracture table.  Preoperative antibiotics were dosed.  The patient's right hip was prepped and draped in the usual sterile fashion.  A second preoperative timeout was called.      The patient was placed supine on a fracture table and appropriate reduction was obtained and visualized on fluoroscopy prior to the beginning of the procedure.  We made an incision proximal to the greater trochanter and dissected down through the fascia.  We then carefully placed our starting pin centered on greater troch on lateral and at highest point on AP .  We advanced pin and then used an opening reamer to open the canal.  A wire was passed to the isthmus and a short nail was placed over this wire after reaming to 12.70mm sequentially.    At this point we placed our nail localizing under fluoroscopy that it was at the appropriate level prior to using the outrigger device to pass a wire and then the cephalo-medullary screw.  The screw was locked proximally to avoid over collapse.  We took final shots at the proximal  femur and then used the outrigger to place one distal interlock screw.  Final pictures were obtained.  The wounds were thoroughly irrigated closed in a multilayer fashion with absorable sutures.  A sterile dressing was placed.  The patient was awoken from general anesthesia and taken to the PACU in stable condition without complication.     Janace LittenBrandon  Parry, OPA-C, present and scrubbed throughout the case, critical for completion in a timely fashion, and for retraction, instrumentation, closure.

## 2017-02-18 NOTE — Progress Notes (Signed)
Complete bed change with peri-care

## 2017-02-18 NOTE — Transfer of Care (Signed)
Immediate Anesthesia Transfer of Care Note  Patient: Debbie Barker  Procedure(s) Performed: INTRAMEDULLARY (IM) NAIL INTERTROCHANTRIC (Right Leg Upper)  Patient Location: PACU  Anesthesia Type:General  Level of Consciousness: awake  Airway & Oxygen Therapy: Patient Spontanous Breathing and Patient connected to nasal cannula oxygen  Post-op Assessment: Report given to RN and Post -op Vital signs reviewed and stable  Post vital signs: Reviewed and stable  Last Vitals:  Vitals:   02/18/17 0546 02/18/17 0800  BP: 111/69 (!) 140/49  Pulse: 92 (!) 104  Resp: 16 16  Temp: 37.1 C   SpO2: 95% 100%    Last Pain:  Vitals:   02/18/17 1236  TempSrc:   PainSc: 9       Patients Stated Pain Goal: 3 (02/18/17 1236)  Complications: No apparent anesthesia complications

## 2017-02-18 NOTE — Interval H&P Note (Signed)
Discussed case, risks and benefits with patient again.  All questions answered, no change to history.  Specifically went over risk of periprosthetic fracture, bleeding and infection.  Ramond Marrowax Varkey MD

## 2017-02-18 NOTE — Anesthesia Preprocedure Evaluation (Addendum)
Anesthesia Evaluation  Patient identified by MRN, date of birth, ID band Patient awake    Reviewed: Allergy & Precautions, H&P , NPO status , Patient's Chart, lab work & pertinent test results  Airway Mallampati: III  TM Distance: >3 FB Neck ROM: Full    Dental no notable dental hx. (+) Poor Dentition, Dental Advisory Given   Pulmonary Current Smoker,    Pulmonary exam normal breath sounds clear to auscultation       Cardiovascular hypertension, negative cardio ROS   Rhythm:Regular Rate:Normal     Neuro/Psych negative neurological ROS  negative psych ROS   GI/Hepatic negative GI ROS, (+) Cirrhosis   Esophageal Varices  substance abuse  alcohol use, Hepatitis -, C  Endo/Other  negative endocrine ROS  Renal/GU Renal InsufficiencyRenal disease  negative genitourinary   Musculoskeletal   Abdominal   Peds  Hematology negative hematology ROS (+)   Anesthesia Other Findings   Reproductive/Obstetrics negative OB ROS                            Anesthesia Physical Anesthesia Plan  ASA: IV  Anesthesia Plan: General   Post-op Pain Management:    Induction: Intravenous  PONV Risk Score and Plan: 3 and Ondansetron and Treatment may vary due to age or medical condition  Airway Management Planned: Oral ETT  Additional Equipment:   Intra-op Plan:   Post-operative Plan: Extubation in OR  Informed Consent: I have reviewed the patients History and Physical, chart, labs and discussed the procedure including the risks, benefits and alternatives for the proposed anesthesia with the patient or authorized representative who has indicated his/her understanding and acceptance.   Dental advisory given  Plan Discussed with: CRNA  Anesthesia Plan Comments:         Anesthesia Quick Evaluation

## 2017-02-18 NOTE — Progress Notes (Signed)
Orthopedic Tech Progress Note Patient Details:  Marinus MawDena Waggle 1970/05/20 [id number]  Ortho Devices Ortho Device/Splint Location: applied ohf to bed Ortho Device/Splint Interventions: Ordered, Application   Post Interventions Patient Tolerated: Well Instructions Provided: Care of device   Jennye MoccasinHughes, Ephrem Carrick Craig 02/18/2017, 6:08 PM

## 2017-02-18 NOTE — Progress Notes (Signed)
Patient had 3/4  small/medium loose stools throughout the night.  Notified the MD.  Will continue to monitor the patient and notify as needed

## 2017-02-18 NOTE — Anesthesia Procedure Notes (Signed)
Procedure Name: Intubation Date/Time: 02/18/2017 2:22 PM Performed by: Dairl PonderJiang, Kadisha Goodine, CRNA Pre-anesthesia Checklist: Patient identified, Emergency Drugs available, Suction available, Patient being monitored and Timeout performed Patient Re-evaluated:Patient Re-evaluated prior to induction Oxygen Delivery Method: Circle system utilized Preoxygenation: Pre-oxygenation with 100% oxygen Induction Type: IV induction Ventilation: Mask ventilation without difficulty Laryngoscope Size: Glidescope and 3 (esogheal varicies ) Grade View: Grade I Tube type: Oral Tube size: 7.0 mm Number of attempts: 1 Airway Equipment and Method: Stylet Placement Confirmation: ETT inserted through vocal cords under direct vision,  positive ETCO2 and breath sounds checked- equal and bilateral Secured at: 22 cm Tube secured with: Tape Dental Injury: Teeth and Oropharynx as per pre-operative assessment

## 2017-02-18 NOTE — Progress Notes (Signed)
Post op Xrays in progress

## 2017-02-18 NOTE — Anesthesia Postprocedure Evaluation (Signed)
Anesthesia Post Note  Patient: Shawonda Pujol  Procedure(s) Performed: INTRAMEDULLARY (IM) NAIL INTERTROCHANTRIC (Right Leg Upper)     Patient location during evaluation: PACU Anesthesia Type: General Level of consciousness: awake and alert Pain management: pain level controlled Vital Signs Assessment: post-procedure vital signs reviewed and stable Respiratory status: spontaneous breathing, nonlabored ventilation and respiratory function stable Cardiovascular status: blood pressure returned to baseline and stable Postop Assessment: no apparent nausea or vomiting Anesthetic complications: no    Last Vitals:  Vitals:   02/18/17 1641 02/18/17 1645  BP:  (!) 153/96  Pulse: 89 86  Resp: 15 12  Temp:    SpO2: 95% 98%    Last Pain:  Vitals:   02/18/17 1236  TempSrc:   PainSc: 9                  Jonatha Gagen,W. EDMOND

## 2017-02-19 ENCOUNTER — Encounter (HOSPITAL_COMMUNITY): Payer: Self-pay | Admitting: Gastroenterology

## 2017-02-19 DIAGNOSIS — B1710 Acute hepatitis C without hepatic coma: Secondary | ICD-10-CM

## 2017-02-19 DIAGNOSIS — K922 Gastrointestinal hemorrhage, unspecified: Secondary | ICD-10-CM

## 2017-02-19 DIAGNOSIS — F1010 Alcohol abuse, uncomplicated: Secondary | ICD-10-CM

## 2017-02-19 DIAGNOSIS — E8809 Other disorders of plasma-protein metabolism, not elsewhere classified: Secondary | ICD-10-CM

## 2017-02-19 DIAGNOSIS — K709 Alcoholic liver disease, unspecified: Secondary | ICD-10-CM

## 2017-02-19 DIAGNOSIS — K7031 Alcoholic cirrhosis of liver with ascites: Principal | ICD-10-CM

## 2017-02-19 DIAGNOSIS — I10 Essential (primary) hypertension: Secondary | ICD-10-CM

## 2017-02-19 DIAGNOSIS — D62 Acute posthemorrhagic anemia: Secondary | ICD-10-CM

## 2017-02-19 DIAGNOSIS — D696 Thrombocytopenia, unspecified: Secondary | ICD-10-CM

## 2017-02-19 DIAGNOSIS — I8511 Secondary esophageal varices with bleeding: Secondary | ICD-10-CM

## 2017-02-19 DIAGNOSIS — R52 Pain, unspecified: Secondary | ICD-10-CM

## 2017-02-19 DIAGNOSIS — K860 Alcohol-induced chronic pancreatitis: Secondary | ICD-10-CM

## 2017-02-19 DIAGNOSIS — K7200 Acute and subacute hepatic failure without coma: Secondary | ICD-10-CM

## 2017-02-19 DIAGNOSIS — D638 Anemia in other chronic diseases classified elsewhere: Secondary | ICD-10-CM

## 2017-02-19 LAB — HEMOGLOBIN AND HEMATOCRIT, BLOOD
HCT: 22.7 % — ABNORMAL LOW (ref 36.0–46.0)
HEMOGLOBIN: 7.5 g/dL — AB (ref 12.0–15.0)

## 2017-02-19 LAB — CBC WITH DIFFERENTIAL/PLATELET
BASOS PCT: 0 %
Basophils Absolute: 0 10*3/uL (ref 0.0–0.1)
EOS PCT: 2 %
Eosinophils Absolute: 0.1 10*3/uL (ref 0.0–0.7)
HEMATOCRIT: 23.8 % — AB (ref 36.0–46.0)
Hemoglobin: 7.6 g/dL — ABNORMAL LOW (ref 12.0–15.0)
Lymphocytes Relative: 18 %
Lymphs Abs: 1.7 10*3/uL (ref 0.7–4.0)
MCH: 28.8 pg (ref 26.0–34.0)
MCHC: 31.9 g/dL (ref 30.0–36.0)
MCV: 90.2 fL (ref 78.0–100.0)
MONO ABS: 0.8 10*3/uL (ref 0.1–1.0)
MONOS PCT: 9 %
Neutro Abs: 6.8 10*3/uL (ref 1.7–7.7)
Neutrophils Relative %: 71 %
PLATELETS: 96 10*3/uL — AB (ref 150–400)
RBC: 2.64 MIL/uL — ABNORMAL LOW (ref 3.87–5.11)
RDW: 19.9 % — AB (ref 11.5–15.5)
WBC: 9.5 10*3/uL (ref 4.0–10.5)

## 2017-02-19 LAB — MAGNESIUM: MAGNESIUM: 1.8 mg/dL (ref 1.7–2.4)

## 2017-02-19 LAB — COMPREHENSIVE METABOLIC PANEL
ALBUMIN: 1.8 g/dL — AB (ref 3.5–5.0)
ALT: 164 U/L — ABNORMAL HIGH (ref 14–54)
ANION GAP: 7 (ref 5–15)
AST: 133 U/L — ABNORMAL HIGH (ref 15–41)
Alkaline Phosphatase: 93 U/L (ref 38–126)
BILIRUBIN TOTAL: 5.7 mg/dL — AB (ref 0.3–1.2)
BUN: 24 mg/dL — ABNORMAL HIGH (ref 6–20)
CO2: 21 mmol/L — ABNORMAL LOW (ref 22–32)
Calcium: 7.7 mg/dL — ABNORMAL LOW (ref 8.9–10.3)
Chloride: 107 mmol/L (ref 101–111)
Creatinine, Ser: 1.44 mg/dL — ABNORMAL HIGH (ref 0.44–1.00)
GFR calc Af Amer: 49 mL/min — ABNORMAL LOW (ref >60)
GFR, EST NON AFRICAN AMERICAN: 42 mL/min — AB (ref >60)
Glucose, Bld: 103 mg/dL — ABNORMAL HIGH (ref 65–99)
POTASSIUM: 3.9 mmol/L (ref 3.5–5.1)
Sodium: 135 mmol/L (ref 135–145)
TOTAL PROTEIN: 5.9 g/dL — AB (ref 6.5–8.1)

## 2017-02-19 LAB — EPSTEIN BARR VRS(EBV DNA BY PCR)
EBV DNA QN by PCR: NEGATIVE {copies}/mL
log10 EBV DNA Qn PCR: UNDETERMINED {Log_copies}/mL

## 2017-02-19 LAB — AMMONIA: AMMONIA: 60 umol/L — AB (ref 9–35)

## 2017-02-19 MED ORDER — SODIUM CHLORIDE 0.9% FLUSH
10.0000 mL | INTRAVENOUS | Status: DC | PRN
  Administered 2017-02-20 (×2): 10 mL
  Administered 2017-02-22 (×2): 20 mL
  Administered 2017-02-23: 30 mL
  Administered 2017-02-26 (×2): 10 mL
  Filled 2017-02-19 (×7): qty 40

## 2017-02-19 MED ORDER — LACTULOSE 10 GM/15ML PO SOLN
10.0000 g | Freq: Two times a day (BID) | ORAL | Status: DC
  Administered 2017-02-19 – 2017-02-21 (×6): 10 g via ORAL
  Filled 2017-02-19 (×6): qty 15

## 2017-02-19 MED ORDER — ALPRAZOLAM 0.5 MG PO TABS
0.5000 mg | ORAL_TABLET | Freq: Once | ORAL | Status: AC
  Administered 2017-02-19: 0.5 mg via ORAL
  Filled 2017-02-19: qty 1

## 2017-02-19 MED ORDER — OXYCODONE HCL 5 MG PO TABS: ORAL_TABLET | ORAL | 0 refills | Status: DC

## 2017-02-19 MED ORDER — SODIUM CHLORIDE 0.9 % IV BOLUS (SEPSIS)
1000.0000 mL | Freq: Once | INTRAVENOUS | Status: AC
  Administered 2017-02-19: 1000 mL via INTRAVENOUS

## 2017-02-19 NOTE — Evaluation (Signed)
Physical Therapy Evaluation Patient Details Name: Debbie Barker MRN: [id number] DOB: 1971-01-01 Today's Date: 02/19/2017   History of Present Illness  Debbie Barker is a 47 y.o. female with medical history significant of alcoholic cirrhosis complicated by chronic pancreatitis, esophageal varices with prior GI bleeds, ascites, active alcohol abuse, depression who comes in with fall and severe leg pain in the setting of presyncopal episode after several days of hematemesis. Pt underwent IM nail R femur (though procedure was delayed due to pt's low hgb). [facility].   Clinical Impression  Pt admitted with above diagnosis. Pt currently with functional limitations due to the deficits listed below (see PT Problem List). Pt very lethargic on eval and with elevated ammonia, not currently following most commands or follows with significantly increased time. Needing mod A +2 for all mobility. [facility].  Pt will benefit from skilled PT to increase their independence and safety with mobility to allow discharge to the venue listed below.       Follow Up Recommendations CIR    Equipment Recommendations  Rolling walker with 5" wheels    Recommendations for Other Services Rehab consult     Precautions / Restrictions Precautions Precautions: Fall Precaution Comments: pt has had multiple falls at home. Brother present and says there is some question of pt's boyfriend's involvement in some of these falls.  Restrictions Weight Bearing Restrictions: Yes RLE Weight Bearing: Weight bearing as tolerated      Mobility  Bed Mobility Overal bed mobility: Needs Assistance Bed Mobility: Supine to Sit     Supine to sit: Mod assist;+2 for physical assistance     General bed mobility comments: pt slow to follow commands. Movement initiated by therapists. Mod A for LE's off EOB though pt able to sit straight up in bed into long sitting,  still needed mod A at trunk to get pt to let go of bed rail to scoot to EOB.   Transfers Overall transfer level: Needs assistance Equipment used: Rolling walker (2 wheeled) Transfers: Sit to/from [employer] CorporationStand;Stand Pivot Transfers Sit to Stand: Mod assist;+2 physical assistance Stand pivot transfers: Mod assist;+2 physical assistance       General transfer comment: mod A +2 for power up and pt's hands had to be guided to RW. [facility]. RW movement facilitated by therapists and pt needed cueing for each step. Pt able to take wt through RLE without knee buckling.   Ambulation/Gait             General Gait Details: pt too lethargic to attempt today  Stairs            Wheelchair Mobility    Modified Rankin (Stroke Patients Only)       Balance Overall balance assessment: Needs assistance Sitting-balance support: Bilateral upper extremity supported Sitting balance-Leahy Scale: Fair Sitting balance - Comments: able to maintain sitting EOB with close supervision   Standing balance support: Bilateral upper extremity supported Standing balance-Leahy Scale: Poor Standing balance comment: heavily reliant on UE support                             Pertinent Vitals/Pain Pain Assessment: Faces Faces Pain Scale: Hurts even more Pain Location: R hip Pain Descriptors / Indicators: Aching;Grimacing;Discomfort Pain Intervention(s): Limited activity within patient's tolerance;Monitored during session    Home Living Family/patient expects to  be discharged to:: Unsure Living Arrangements: Spouse/significant other               Additional Comments: pt lives with boyfriend    Prior Function Level of Independence: Independent         Comments: pt's brother reports that physically pt was independent but she was having a lot of emotional issues PTA     Hand Dominance        Extremity/Trunk Assessment   Upper Extremity  Assessment Upper Extremity Assessment: Defer to OT evaluation    Lower Extremity Assessment Lower Extremity Assessment: RLE deficits/detail RLE Deficits / Details: pain with movement. R hip flex 2-/5, knee ext 3-/5. Swelling noted from hip to below knee.  RLE: Unable to fully assess due to pain RLE Coordination: decreased fine motor;decreased gross motor    Cervical / Trunk Assessment Cervical / Trunk Assessment: Normal  Communication   Communication: No difficulties  Cognition Arousal/Alertness: Lethargic;Suspect due to medications Behavior During Therapy: Flat affect Overall Cognitive Status: Impaired/Different from baseline Area of Impairment: Orientation;Memory;Following commands;Problem solving                 Orientation Level: Disoriented to;Time;Situation   Memory: Decreased short-term memory Following Commands: Follows one step commands with increased time;Follows one step commands inconsistently     Problem Solving: Slow processing;Decreased initiation;Requires verbal cues;Requires tactile cues;Difficulty sequencing General Comments: pt very lethargic, need frequent instructional cues for all mobility tasks. Cannot give any reliable history.       General Comments General comments (skin integrity, edema, etc.): O2 sats 93% on RA. Pt's RN notes that pt with elevated ammonia levels which is currently affecting pt's cognition.     Exercises General Exercises - Lower Extremity Ankle Circles/Pumps: AROM;Both;10 reps;Supine   Assessment/Plan    PT Assessment Patient needs continued PT services  PT Problem List Decreased strength;Decreased range of motion;Decreased activity tolerance;Decreased balance;Decreased mobility;Decreased coordination;Decreased cognition;Decreased knowledge of use of DME;Decreased safety awareness;Decreased knowledge of precautions;Pain       PT Treatment Interventions DME instruction;Gait training;Functional mobility training;Therapeutic  activities;Therapeutic exercise;Balance training;Cognitive remediation;Patient/family education;Neuromuscular re-education    PT Goals (Current goals can be found in the Care Plan section)  Acute Rehab PT Goals Patient Stated Goal: none stated PT Goal Formulation: Patient unable to participate in goal setting Time For Goal Achievement: 03/05/17 Potential to Achieve Goals: Good    Frequency Min 3X/week   Barriers to discharge Decreased caregiver support unsure if prior environment is safe    Co-evaluation PT/OT/SLP Co-Evaluation/Treatment: Yes Reason for Co-Treatment: For patient/therapist safety;Necessary to address cognition/behavior during functional activity PT goals addressed during session: Mobility/safety with mobility;Balance;Proper use of DME         AM-PAC PT "6 Clicks" Daily Activity  Outcome Measure Difficulty turning over in bed (including adjusting bedclothes, sheets and blankets)?: Unable Difficulty moving from lying on back to sitting on the side of the bed? : Unable Difficulty sitting down on and standing up from a chair with arms (e.g., wheelchair, bedside commode, etc,.)?: Unable Help needed moving to and from a bed to chair (including a wheelchair)?: Total Help needed walking in hospital room?: Total Help needed climbing 3-5 steps with a railing? : Total 6 Click Score: 6    End of Session Equipment Utilized During Treatment: Gait belt Activity Tolerance: Patient limited by lethargy Patient left: in chair;with call bell/phone within reach;with family/visitor present;with chair alarm set Nurse Communication: Mobility status PT Visit Diagnosis: Unsteadiness on feet (R26.81);Muscle weakness (generalized) (M62.81);Repeated falls (  R29.6);Pain;Difficulty in walking, not elsewhere classified (R26.2) Pain - Right/Left: Right Pain - part of body: Hip    Time: 1132-1213 PT Time Calculation (min) (ACUTE ONLY): 41 min   Charges:   PT Evaluation $PT Eval Moderate  Complexity: 1 Mod PT Treatments $Therapeutic Activity: 8-22 mins   PT G Codes:        Lyanne Co, PT  Acute Rehab Services  [phone]   Pena L Crisol Muecke 02/19/2017, 12:52 PM

## 2017-02-19 NOTE — Progress Notes (Signed)
Inpatient Rehabilitation  Per PT request, patient was screened by Artha Stavros for appropriateness for an Inpatient Acute Rehab consult.  At this time we are recommending an Inpatient Rehab consult.  Text paged MD to notify.  Please order if you are agreeable.    Keysean Savino, M.A., CCC/SLP Admission Coordinator  [facility]  Cell [phone]  

## 2017-02-19 NOTE — Consult Note (Addendum)
Physical Medicine and Rehabilitation Consult   Reason for Consult: Right hip fracture, hepatic encephalopathy, GIB   Referring Physician: Dr. Hanley Ben   HPI: Debbie Barker is a 47 y.o. female with history of HTN, alcohol abuse with ascites, pancreatitis, esophageal varices, prior GIB, recent discharge from psychiatric hospital. History taken from chart review.  She was  admitted on 02/15/17 after fall with severe right hip pain. She was found to have right displaced IT fracture, significant anemia with Hgb 3.2 and INR 2.65. She was transfused with 2 units PRBC, started on  Octreotide as well as IV antibiotics. CT head reviewed, unremarkable for acute intracranial process. Dr. Levora Angel consulted for input and recommended treatment with IV Vitamin K and  patient underwent EGD with banding of 4 esophageal varices on 2/15.  Abdominal ultrasound showed advanced cirrhosis and blood work positive for Hep C. Continue IV PPI bid and was cleared for orthopedic intervention. She underwent IM nailing of right femur on 2/17 by Dr. Everardo Pacific and post op to be WBAT. No anticoagulation due to high risk of rebleed as well as coagulopathy. Patient continues to be encephalopathic with lethargy and CIR recommended due to functional deficits.    Review of Systems  Unable to perform ROS: Mental acuity   Past Medical History:  Diagnosis Date  . Alcohol abuse 02/15/2017  . Alcoholic cirrhosis of liver with ascites (HCC) 02/15/2017  . Chronic pancreatitis (HCC)   . Hypertension   . Secondary esophageal varices with bleeding (HCC) 02/15/2017    Past Surgical History:  Procedure Laterality Date  . ESOPHAGOGASTRODUODENOSCOPY (EGD) WITH PROPOFOL N/A 02/16/2017   Procedure: ESOPHAGOGASTRODUODENOSCOPY (EGD) WITH PROPOFOL;  Surgeon: Kathi Der, MD;  Location: [facility];  Service: Gastroenterology;  Laterality: N/A;    Family history: unable to elicit   Social History:  Lives with boyfriend.  Unemployed--boyfriend does not work as he's a "inheritance baby". Per reports that she has been smoking.  she has never used smokeless tobacco. Per  reports that she drinks alcohol. She reports that she uses drugs. Drugs: Barbituates and Marijuana.   Allergies  Allergen Reactions  . Penicillins     UNSPECIFIED REACTION   Has patient had a PCN reaction causing immediate rash, facial/tongue/throat swelling, SOB or lightheadedness with hypotension: Unknown Has patient had a PCN reaction causing severe rash involving mucus membranes or skin necrosis: Unknown Has patient had a PCN reaction that required hospitalization: Unknown Has patient had a PCN reaction occurring within the last 10 years: No If all of the above answers are "NO", then may proceed with Cephalosporin use.    Medications Prior to Admission  Medication Sig Dispense Refill  . albuterol (PROVENTIL HFA;VENTOLIN HFA) 108 (90 Base) MCG/ACT inhaler Inhale 1-2 puffs into the lungs every 6 (six) hours as needed for wheezing or shortness of breath.    . carvedilol (COREG) 6.25 MG tablet Take 6.25 mg by mouth 2 (two) times daily with a meal.    . FLUoxetine HCl (PROZAC PO) Take by mouth.    . furosemide (LASIX) 20 MG tablet Take 20 mg by mouth daily.    Marland Kitchen gabapentin (NEURONTIN) 300 MG capsule Take 300 mg by mouth 3 (three) times daily.    Marland Kitchen omeprazole (PRILOSEC) 20 MG capsule Take 20 mg by mouth daily.    Marland Kitchen OMEPRAZOLE PO Take by mouth.      Home: Home Living Family/patient expects to be discharged to:: Unsure Living Arrangements: Spouse/significant other Additional Comments: pt lives with boyfriend  Functional History: Prior Function Level of Independence: Independent Comments: pt's brother reports that physically pt was independent but she was having a lot of emotional issues PTA Functional Status:  Mobility: Bed Mobility Overal bed mobility: Needs Assistance Bed Mobility: Supine to Sit Supine to sit: Mod assist, +2 for  physical assistance General bed mobility comments: pt slow to follow commands. Movement initiated by therapists. Mod A for LE's off EOB though pt able to sit straight up in bed into long sitting, still needed mod A at trunk to get pt to let go of bed rail to scoot to EOB.  Transfers Overall transfer level: Needs assistance Equipment used: Rolling walker (2 wheeled) Transfers: Sit to/from Stand, [employer] Pivot Transfers Sit to Stand: Mod assist, +2 physical assistance Stand pivot transfers: Mod assist, +2 physical assistance General transfer comment: mod A +2 for power up and pt's hands had to be guided to RW. Pt pivoted bed to [facility] and BSC to recliner. RW movement facilitated by therapists and pt needed cueing for each step. Pt able to take wt through RLE without knee buckling.  Ambulation/Gait General Gait Details: pt too lethargic to attempt today    ADL:    Cognition: Cognition Overall Cognitive Status: Impaired/Different from baseline Orientation Level: Oriented to person, Disoriented to situation, Disoriented to time, Oriented to place Cognition Arousal/Alertness: Lethargic, Suspect due to medications Behavior During Therapy: Flat affect Overall Cognitive Status: Impaired/Different from baseline Area of Impairment: Orientation, Memory, Following commands, Problem solving Orientation Level: Disoriented to, Time, Situation Memory: Decreased short-term memory Following Commands: Follows one step commands with increased time, Follows one step commands inconsistently Problem Solving: Slow processing, Decreased initiation, Requires verbal cues, Requires tactile cues, Difficulty sequencing General Comments: pt very lethargic, need frequent instructional cues for all mobility tasks. Cannot give any reliable history.    Blood pressure (!) 102/53, pulse 73, temperature 98.4 F (36.9 C), temperature source Oral, resp. rate 14, height 5\' 8"  (1.727 m), weight 69.8 kg (153 lb 14.1 oz), SpO2 97  %. Physical Exam  Nursing note and vitals reviewed. Constitutional: She appears well-developed and well-nourished. She appears lethargic. She is easily aroused. She has a sickly appearance. No distress.  Mittens on bilateral hands and she attempting to pull them off  HENT:  Head: Normocephalic and atraumatic.  Mouth/Throat: Oropharynx is clear and moist.  Eyes: Conjunctivae and EOM are normal. Pupils are equal, round, and reactive to light.  Neck: Normal range of motion. Neck supple.  Cardiovascular: Normal rate and regular rhythm.  Respiratory: Effort normal and breath sounds normal. No stridor. No respiratory distress. She has no wheezes.  GI: Soft. Bowel sounds are normal. She exhibits no distension.  Musculoskeletal: She exhibits edema and tenderness.  Surgical dressing right hip with moderate edema.  Anxiety with attempts as movement.   Neurological: She is easily aroused. She appears lethargic.  Oriented to self and place as hospital She was unable to stay awake, confused and noted to be internally distracted. Slurred speech and she had difficulty following simple commands.  Motor: B/l UE 4-/5 LLE:3+/5 RLE: HF: KE 2-/5, ADF/PF 3/5 (Pain inhibition) RLE limited by pain.  Skin: Skin is warm and dry. She is not diaphoretic.  Psychiatric: Her affect is blunt. Her speech is slurred. She is slowed. Cognition and memory are impaired. She expresses impulsivity. She is inattentive.    Results for orders placed or performed during the hospital encounter of 02/15/17 (from the past 24 hour(s))  CBC with Differential/Platelet  Status: Abnormal   Collection Time: 02/19/17  1:24 AM  Result Value Ref Range   WBC 9.5 4.0 - 10.5 K/uL   RBC 2.64 (L) 3.87 - 5.11 MIL/uL   Hemoglobin 7.6 (L) 12.0 - 15.0 g/dL   HCT 40.9 (L) 81.1 - 91.4 %   MCV 90.2 78.0 - 100.0 fL   MCH 28.8 26.0 - 34.0 pg   MCHC 31.9 30.0 - 36.0 g/dL   RDW 78.2 (H) 95.6 - 21.3 %   Platelets 96 (L) 150 - 400 K/uL    Neutrophils Relative % 71 %   Neutro Abs 6.8 1.7 - 7.7 K/uL   Lymphocytes Relative 18 %   Lymphs Abs 1.7 0.7 - 4.0 K/uL   Monocytes Relative 9 %   Monocytes Absolute 0.8 0.1 - 1.0 K/uL   Eosinophils Relative 2 %   Eosinophils Absolute 0.1 0.0 - 0.7 K/uL   Basophils Relative 0 %   Basophils Absolute 0.0 0.0 - 0.1 K/uL  Comprehensive metabolic panel     Status: Abnormal   Collection Time: 02/19/17  1:24 AM  Result Value Ref Range   Sodium 135 135 - 145 mmol/L   Potassium 3.9 3.5 - 5.1 mmol/L   Chloride 107 101 - 111 mmol/L   CO2 21 (L) 22 - 32 mmol/L   Glucose, Bld 103 (H) 65 - 99 mg/dL   BUN 24 (H) 6 - 20 mg/dL   Creatinine, Ser 0.86 (H) 0.44 - 1.00 mg/dL   Calcium 7.7 (L) 8.9 - 10.3 mg/dL   Total Protein 5.9 (L) 6.5 - 8.1 g/dL   Albumin 1.8 (L) 3.5 - 5.0 g/dL   AST 578 (H) 15 - 41 U/L   ALT 164 (H) 14 - 54 U/L   Alkaline Phosphatase 93 38 - 126 U/L   Total Bilirubin 5.7 (H) 0.3 - 1.2 mg/dL   GFR calc non Af Amer 42 (L) >60 mL/min   GFR calc Af Amer 49 (L) >60 mL/min   Anion gap 7 5 - 15  Magnesium     Status: None   Collection Time: 02/19/17  1:24 AM  Result Value Ref Range   Magnesium 1.8 1.7 - 2.4 mg/dL  Ammonia     Status: Abnormal   Collection Time: 02/19/17  9:35 AM  Result Value Ref Range   Ammonia 60 (H) 9 - 35 umol/L  Hemoglobin and hematocrit, blood     Status: Abnormal   Collection Time: 02/19/17 12:16 PM  Result Value Ref Range   Hemoglobin 7.5 (L) 12.0 - 15.0 g/dL   HCT 46.9 (L) 62.9 - 52.8 %   Dg C-arm 1-60 Min  Result Date: 02/18/2017 CLINICAL DATA:  Right intertrochanteric femur ORIF. EXAM: OPERATIVE RIGHT HIP (WITH PELVIS IF PERFORMED) TECHNIQUE: Fluoroscopic spot image(s) were submitted for interpretation post-operatively. Fluoroscopy time is 1 minute, 17 seconds. COMPARISON:  Right hip x-rays dated February 15, 2017. FINDINGS: Intraoperative x-rays demonstrate interval short cephalomedullary nail fixation of the right intertrochanteric femur fracture.  Alignment is near anatomic. No evidence of hardware complication. IMPRESSION: Right intertrochanteric femur fracture ORIF in near anatomic alignment. Electronically Signed   By: Obie Dredge M.D.   On: 02/18/2017 15:44   Dg Hip Port Unilat With Pelvis 1v Right  Result Date: 02/18/2017 CLINICAL DATA:  Postoperative status post ORIF right proximal femur fracture EXAM: DG HIP (WITH OR WITHOUT PELVIS) 1V PORT RIGHT COMPARISON:  02/15/2017 right hip radiograph FINDINGS: Comminuted intertrochanteric right proximal femur fracture is in near-anatomic alignment status  post transfixation by intramedullary rod with interlocking right femoral neck pin and distal interlocking screw. No right hip dislocation. 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 No suspicious focal osseous lesions. Expected soft tissue emphysema surrounding surgical repair site in the right hip and thigh. IMPRESSION: Satisfactory immediate postoperative appearance status post ORIF intertrochanteric proximal right femur fracture, in near anatomic alignment. Electronically Signed   By: Delbert Phenix M.D.   On: 02/18/2017 18:03   Dg Hip Operative Unilat W Or W/o Pelvis Right  Result Date: 02/18/2017 CLINICAL DATA:  Right intertrochanteric femur ORIF. EXAM: OPERATIVE RIGHT HIP (WITH PELVIS IF PERFORMED) TECHNIQUE: Fluoroscopic spot image(s) were submitted for interpretation post-operatively. Fluoroscopy time is 1 minute, 17 seconds. COMPARISON:  Right hip x-rays dated February 15, 2017. FINDINGS: Intraoperative x-rays demonstrate interval short cephalomedullary nail fixation of the right intertrochanteric femur fracture. Alignment is near anatomic. No evidence of hardware complication. IMPRESSION: Right intertrochanteric femur fracture ORIF in near anatomic alignment. Electronically Signed   By: Obie Dredge M.D.   On: 02/18/2017 15:44    Assessment/Plan: Diagnosis: Debility Labs and images independently reviewed.  Records reviewed and summated  above.  1. Does the need for close, 24 hr/day medical supervision in concert with the patient's rehab needs make it unreasonable for this patient to be served in a less intensive setting? Yes  2. Co-Morbidities requiring supervision/potential complications: Hep C (recs per GI), ABLA on anemia of chronic disease (transfuse if necessary to ensure appropriate perfusion for increased activity tolerance), HTN (monitor and provide prns in accordance with increased physical exertion and pain), alcohol abuse (counsel when appropriate), ascites (recs per GI), pancreatitis (recs per GI), esophageal varices (see previous), prior GIB (see previous), recent discharge from psychiatric hospital, right hip fracture, hepatic encephalopathy, GIB, CKD (avoid nephrotoxic meds), Thrombocytopenia (< 60,000/mm3 no resistive exercise), pain (Biofeedback training with therapies to help reduce reliance on opiate pain medications, particularly IV morphine and fentanyl, monitor pain control during therapies, and sedation at rest and titrate to maximum efficacy to ensure participation and gains in therapies) 3. Due to safety, skin/wound care, disease management, medication administration, pain management and patient education, does the patient require 24 hr/day rehab nursing? Yes 4. Does the patient require coordinated care of a physician, rehab nurse, PT (1-2 hrs/day, 5 days/week), OT (1-2 hrs/day, 5 days/week) and SLP (1-2 hrs/day, 5 days/week) to address physical and functional deficits in the context of the above medical diagnosis(es)? Yes Addressing deficits in the following areas: balance, endurance, locomotion, strength, transferring, bathing, dressing, toileting, cognition and psychosocial support 5. Can the patient actively participate in an intensive therapy program of at least 3 hrs of therapy per day at least 5 days per week? Potentially 6. The potential for patient to make measurable gains while on inpatient rehab is  excellent 7. Anticipated functional outcomes upon discharge from inpatient rehab are supervision and min assist  with PT, supervision and min assist with OT, min assist with SLP. 8. Estimated rehab length of stay to reach the above functional goals is: 17-20 days. 9. Anticipated D/C setting: Home 10. Anticipated post D/C treatments: HH therapy and Home excercise program 11. Overall Rehab/Functional Prognosis: good  RECOMMENDATIONS: This patient's condition is appropriate for continued rehabilitative care in the following setting: Will need to investigate baseline mental status.  Likely CIR if deficits more physical in nature when patient able to meaningfully participate in 3 hours of therapy/day. Patient has agreed to participate in recommended program. Potentially Note that insurance prior authorization may be  required for reimbursement for recommended care.  Comment: Rehab Admissions Coordinator to follow up.  Maryla Morrow, MD, ABPMR Jacquelynn Cree, PA-C 02/19/2017

## 2017-02-19 NOTE — Progress Notes (Signed)
ORTHOPAEDIC PROGRESS NOTE  s/p Procedure(s): INTRAMEDULLARY (IM) NAIL INTERTROCHANTRIC  SUBJECTIVE: Complains of pain but also falling asleep between sentences.    OBJECTIVE: PE: UJW:[id number]:dressing reinforced, CDI, leg lengths equal, warm well perfused foot, intact EHL/TA/GSC  HB 7.4 this morning  Vitals:   02/19/17 0044 02/19/17 0548  BP: (!) 94/59 (!) 102/53  Pulse:  73  Resp:  14  Temp:  98.4 F (36.9 C)  SpO2:  97%     ASSESSMENT: Debbie MawDena Barker is a 47 y.o. female doing well postoperatively.  PLAN: Weightbearing: WBAT RLE Insicional and dressing care: Dressing will likely continue to occasionally bleed through due to coagulopathy.  Please reinforce as needed.  After 3-4 days can likely take down and leave open. Orthopedic device(s): None Showering: not until dressing change VTE prophylaxis: Not indicated per ortho as patient INR around 2 at baseline.  Defer to medicine  Pain control: PRN meds.  Patient seems to be either sedated or asking for pain medications.  Limited options on non narcotics as she has liver issues as well as a gi bleed.  Recommend just continuing oxycodone and icing area. Follow - up plan: 2 weeks Contact information:  Weekdays 8-5 [address] Debbie Erazo MD [phone], After hours and holidays please check [email or web address] for group call information for [facility]

## 2017-02-19 NOTE — Evaluation (Signed)
Occupational Therapy Evaluation Patient Details Name: Debbie Barker MRN: [id number] DOB: 07-May-1970 Today's Date: 02/19/2017    History of Present Illness Debbie Barker is a 47 y.o. female with medical history significant of alcoholic cirrhosis complicated by chronic pancreatitis, esophageal varices with prior GI bleeds, ascites, active alcohol abuse, depression who comes in with fall and severe leg pain in the setting of presyncopal episode after several days of hematemesis. Pt underwent IM nail R femur (though procedure was delayed due to pt's low hgb). Pt recently d[facility].    Clinical Impression   This 47 y/o F presents with the above. At baseline Pt is independent with ADLs and functional mobility. Pt presents supine in bed willing to work with therapy; presenting with overall increased pain, weakness, decreased functional performance. Pt completing stand pivot transfers this session at RW level with ModA+2, requiring increased time and multimodal cues throughout to complete (suspect partly due to lethargy, cognitive impairments). Currently requires maxA for LB and toileting ADLs. Pt will benefit from continued OT services in acute and post acute settings to maximize her overall safety and independence with ADLs and mobility prior to return home.     Follow Up Recommendations  CIR;Supervision/Assistance - 24 hour    Equipment Recommendations  Other (comment)(TBD in next venue)           Precautions / Restrictions Precautions Precautions: Fall Precaution Comments: pt has had multiple falls at home. Brother present and says there is some question of pt's boyfriend's involvement in some of these falls.  Restrictions Weight Bearing Restrictions: Yes RLE Weight Bearing: Weight bearing as tolerated      Mobility Bed Mobility Overal bed mobility: Needs Assistance Bed Mobility: Supine to Sit     Supine to sit: Mod assist;+2 for physical assistance      General bed mobility comments: pt slow to follow commands. Movement initiated by therapists. Mod A for LE's off EOB though pt able to sit straight up in bed into long sitting, still needed mod A at trunk to get pt to let go of bed rail to scoot to EOB.   Transfers Overall transfer level: Needs assistance Equipment used: Rolling walker (2 wheeled) Transfers: Sit to/from [employer] CorporationStand;Stand Pivot Transfers Sit to Stand: Mod assist;+2 physical assistance Stand pivot transfers: Mod assist;+2 physical assistance       General transfer comment: mod A +2 for power up and pt's hands had to be guided to RW. [facility] to recliner. RW movement facilitated by therapists and pt needed cueing for each step. Pt able to take wt through RLE without knee buckling.     Balance Overall balance assessment: Needs assistance Sitting-balance support: Bilateral upper extremity supported Sitting balance-Leahy Scale: Fair Sitting balance - Comments: able to maintain sitting EOB with close supervision   Standing balance support: Bilateral upper extremity supported Standing balance-Leahy Scale: Poor Standing balance comment: heavily reliant on UE support                           ADL either performed or assessed with clinical judgement   ADL Overall ADL's : Needs assistance/impaired Eating/Feeding: Minimal assistance;Sitting Eating/Feeding Details (indicate cue type and reason): Pt requiring increased assist to drink this session (suspect partly due to cognition/lethargy), attempting to bring cup with lid/straw to mouth and drink from cup while cup still has lid/straw in place, requires cues to correct  Grooming: Set up;Sitting  Upper Body Bathing: Minimal assistance;Sitting   Lower Body Bathing: Moderate assistance;Sit to/from stand;+2 for safety/equipment;+2 for physical assistance   Upper Body Dressing : Minimal assistance;Sitting   Lower Body Dressing: +2 for physical  assistance;+2 for safety/equipment;Sit to/from stand;Maximal assistance   Toilet Transfer: Moderate assistance;+2 for physical assistance;+2 for safety/equipment;Stand-pivot;BSC;RW   Toileting- Clothing Manipulation and Hygiene: Moderate assistance;+2 for physical assistance;+2 for safety/equipment;Sit to/from stand Toileting - Clothing Manipulation Details (indicate cue type and reason): assist for peri-care and gown management      Functional mobility during ADLs: Moderate assistance;+2 for physical assistance;+2 for safety/equipment General ADL Comments: increased time and cues for transfers                         Pertinent Vitals/Pain Pain Assessment: Faces Faces Pain Scale: Hurts even more Pain Location: R hip Pain Descriptors / Indicators: Aching;Grimacing;Discomfort Pain Intervention(s): Limited activity within patient's tolerance;Monitored during session          Extremity/Trunk Assessment Upper Extremity Assessment Upper Extremity Assessment: Generalized weakness   Lower Extremity Assessment Lower Extremity Assessment: Defer to PT evaluation RLE Deficits / Details: pain with movement. R hip flex 2-/5, knee ext 3-/5. Swelling noted from hip to below knee.  RLE: Unable to fully assess due to pain RLE Coordination: decreased fine motor;decreased gross motor   Cervical / Trunk Assessment Cervical / Trunk Assessment: Normal   Communication Communication Communication: No difficulties   Cognition Arousal/Alertness: Lethargic;Suspect due to medications Behavior During Therapy: Flat affect Overall Cognitive Status: Impaired/Different from baseline Area of Impairment: Orientation;Memory;Following commands;Problem solving                 Orientation Level: Disoriented to;Time;Situation   Memory: Decreased short-term memory Following Commands: Follows one step commands with increased time;Follows one step commands inconsistently     Problem Solving: Slow  processing;Decreased initiation;Requires verbal cues;Requires tactile cues;Difficulty sequencing General Comments: pt very lethargic, need frequent instructional cues for all mobility tasks. Cannot give any reliable history.    General Comments  O2 sats 93% on RA. Pt's RN notes that pt with elevated ammonia levels which is currently affecting pt's cognition.               Home Living Family/patient expects to be discharged to:: Unsure Living Arrangements: Spouse/significant other                               Additional Comments: pt lives with boyfriend      Prior Functioning/Environment Level of Independence: Independent        Comments: pt's brother reports that physically pt was independent but she was having a lot of emotional issues PTA        OT Problem List: Decreased strength;Decreased activity tolerance;Decreased range of motion;Decreased cognition;Decreased knowledge of use of DME or AE;Impaired balance (sitting and/or standing)      OT Treatment/Interventions: Self-care/ADL training;DME and/or AE instruction;Therapeutic activities;Balance training;Therapeutic exercise;Energy conservation;Patient/family education    OT Goals(Current goals can be found in the care plan section) Acute Rehab OT Goals Patient Stated Goal: none stated OT Goal Formulation: With patient Time For Goal Achievement: 03/05/17 Potential to Achieve Goals: Good  OT Frequency: Min 2X/week               Co-evaluation PT/OT/SLP Co-Evaluation/Treatment: Yes Reason for Co-Treatment: For patient/therapist safety;Necessary to address cognition/behavior during functional activity PT goals addressed during session: Mobility/safety with mobility;Balance;Proper use  of DME OT goals addressed during session: ADL's and self-care;Proper use of Adaptive equipment and DME      AM-PAC PT "6 Clicks" Daily Activity     Outcome Measure Help from another person eating meals?: A Little Help  from another person taking care of personal grooming?: A Little Help from another person toileting, which includes using toliet, bedpan, or urinal?: A Lot Help from another person bathing (including washing, rinsing, drying)?: A Lot Help from another person to put on and taking off regular upper body clothing?: A Little Help from another person to put on and taking off regular lower body clothing?: A Lot 6 Click Score: 15   End of Session Equipment Utilized During Treatment: Gait belt;Rolling walker Nurse Communication: Mobility status  Activity Tolerance: Patient tolerated treatment well;Patient limited by lethargy Patient left: in chair;with call bell/phone within reach;with chair alarm set;with family/visitor present  OT Visit Diagnosis: Unsteadiness on feet (R26.81);Other abnormalities of gait and mobility (R26.89)                Time: 1610-9604 OT Time Calculation (min): 41 min Charges:  OT General Charges $OT Visit: 1 Visit OT Evaluation $OT Eval Moderate Complexity: 1 Mod G-Codes:     Marcy Siren, OT Pager [phone] 02/19/2017   Orlando Penner 02/19/2017, 2:13 PM

## 2017-02-19 NOTE — Progress Notes (Addendum)
Patient ID: Debbie Barker, female   DOB: Sep 22, 1970, 47 y.o.   MRN: [id number]  PROGRESS NOTE    Debbie Barker  WUJ:[id number] DOB: [birth year] DOA: 02/15/2017 PCP: Lavinia Sharps, NP   Brief Narrative:  47 year old female with history of alcoholic cirrhosis, esophageal varices with prior GI bleeds, ascites, chronic pancreatitis, active alcohol abuse, depression presented on 02/15/2017 with fall and severe leg pain in the setting of presyncopal episode after several days of hematemesis.  She was found to have intertrochanteric right hip fracture.  Orthopedics was consulted.  She was initially found to have hemoglobin of 3.2 for which she was given blood transfusion.  She was started on octreotide and Protonix.  GI was consulted.  She underwent EGD on 02/16/2017 with 4 bands placement.  Patient was transferred to [facility] for further orthopedic intervention.   Assessment & Plan:   Principal Problem:   Acute upper GI bleeding Active Problems:   Alcoholic cirrhosis of liver with ascites (HCC)   Secondary esophageal varices with bleeding (HCC)   AKI (acute kidney injury) (HCC)   Alcohol abuse   Acute on chronic alcoholic liver disease (HCC)   Acute upper GI bleed   Acute blood loss anemia probably from upper GI bleeding -Hemoglobin 7.6 this morning.  Status post 4 units of packed red cells transfusion since admission.  We will hold off on transfusion unless there is active bleeding. -Monitor H&H.  No overnight hematemesis or melena  Upper GI bleeding probably secondary to bleeding esophageal varices status post banding -Continue octreotide.  Continue Protonix.  GI following  Decompensated cirrhosis of liver probably due to combination of alcohol use and history of hepatitis C with ascites and elevated LFTs and mild coagulopathy -GI following.  Continue antibiotics empirically. -Not a candidate for liver transplant because of ongoing alcohol abuse -Trend LFTs and INR  Altered mental  status probably secondary to hepatic encephalopathy -Check ammonia.  Might need lactulose  Thrombocytopenia -Probably secondary to decompensated cirrhosis of liver -Monitor  Right hip intertrochanteric fracture status post fall -Orthopedics following.  Status post surgery on 02/18/2017.  Wound care and dressing changes as per orthopedics -Fall precautions -Patient cannot be put on Lovenox for DVT prophylaxis because of coagulopathy  Alcohol abuse -Continue CIWA protocol.  Continue thiamine, folate and multivitamin  Hypomagnesemia -Improved  Hypoalbuminemia -Probably second to poor oral intake -Nutrition consult  DVT prophylaxis: SCDs Code Status: DNR Family Communication: None at bedside Disposition Plan: Depends on clinical outcome  Consultants: Orthopedics and GI  Procedures:  EGD on 02/16/2017 showed large esophageal varices with stigmata of recent bleeding. 4 bands placed. 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  02/18/2017: Right intertrochanteric intramedullary nailing  Antimicrobials:  Azactam from 02/15/2017 onwards  Subjective: Seen and examined at bedside.  She is sleepy, wakes up on calling her name but is drowsy and confused.  Poor historian.  No overnight fever or vomiting.  No overnight melanotic or bloody stools. Objective: Vitals:   02/18/17 2024 02/18/17 2126 02/19/17 0044 02/19/17 0548  BP: 96/61 (!) 92/58 (!) 94/59 (!) 102/53  Pulse: 74 74  73  Resp: 13 12  14   Temp: 97.7 F (36.5 C) 97.7 F (36.5 C)  98.4 F (36.9 C)  TempSrc: Oral Axillary  Oral  SpO2: 97% 100%  97%  Weight:      Height:        Intake/Output Summary (Last 24 hours) at 02/19/2017 1001 Last data filed at 02/19/2017 0410 Gross per  24 hour  Intake 1645.16 ml  Output 200 ml  Net 1445.16 ml   Filed Weights   02/15/17 2106 02/16/17 0045 02/17/17 1858  Weight: 72.6 kg (160 lb) 62.3 kg (137 lb 5.6 oz) 69.8 kg (153 lb 14.1 oz)    Examination:  General exam:  Appears  older than stated age.  Sleeping, wakes up slightly and answers of a few questions but is confused Respiratory system: Bilateral decreased breath sounds at bases  cardiovascular system: S1-S2 heard, rate controlled  Gastrointestinal system: Abdomen is distended, soft and nontender. Normal bowel sounds heard. Central nervous system: Sleepy, wakes up on calling her name. No focal neurological deficits. Moving extremities Extremities: No cyanosis, clubbing, edema  Skin: No rashes, lesions or ulcers Lymph: No cervical lymphadenopathy  Data Reviewed: I have personally reviewed following labs and imaging studies  CBC: Recent Labs  Lab 02/15/17 0616  02/16/17 0126  02/17/17 0312  02/17/17 1012 02/17/17 1348 02/17/17 1747 02/18/17 0749 02/18/17 1031 02/19/17 0124  WBC 4.7  --  7.7  --  10.8*  --   --   --   --   --  8.2 9.5  NEUTROABS 2.8  --   --   --   --   --   --   --   --   --  5.5 6.8  HGB 3.2*   < > 7.0*   < > 7.6*   < > 7.1* 8.7* 8.0* 8.4* 9.2* 7.6*  HCT 9.8*   < > 20.0*   < > 22.4*   < > 20.6* 25.6* 23.7*  --  28.2* 23.8*  MCV 91.6  --  88.1  --  88.9  --   --   --   --   --  89.2 90.2  PLT 97*  --  153  --  173  --   --   --   --   --  109* 96*   < > = values in this interval not displayed.   Basic Metabolic Panel: Recent Labs  Lab 02/15/17 1618 02/16/17 0126 02/17/17 0039 02/18/17 1031 02/19/17 0124  NA 132* 129* 130* 133* 135  K 2.8* 4.5 3.9 4.2 3.9  CL 110 101 104 107 107  CO2 19* 21* 19* 18* 21*  GLUCOSE 86 119* 148* 106* 103*  BUN 48* 53* 44* 23* 24*  CREATININE 1.21* 1.42* 1.63* 1.25* 1.44*  CALCIUM 5.6* 7.5* 7.7* 7.8* 7.7*  MG  --  2.1  --  1.4* 1.8   GFR: Estimated Creatinine Clearance: 48.7 mL/min (A) (by C-G formula based on SCr of 1.44 mg/dL (H)). Liver Function Tests: Recent Labs  Lab 02/15/17 0616 02/16/17 0126 02/17/17 0312 02/18/17 1031 02/19/17 0124  AST 1,069* 714* 296* 162* 133*  ALT 360* 350* 254* 190* 164*  ALKPHOS 86 106 97  100 93  BILITOT 4.1* 9.1* 7.7* 6.4* 5.7*  PROT 5.3* 6.3* 6.1* 6.2* 5.9*  ALBUMIN 1.7* 2.0* 1.9* 1.9* 1.8*   Recent Labs  Lab 02/15/17 0616  LIPASE 73*   Recent Labs  Lab 02/15/17 0644  AMMONIA 26   Coagulation Profile: Recent Labs  Lab 02/15/17 0616 02/16/17 0126 02/17/17 0312 02/18/17 1031  INR 2.65 2.13 2.04 1.93   Cardiac Enzymes: No results for input(s): CKTOTAL, CKMB, CKMBINDEX, TROPONINI in the last 168 hours. BNP (last 3 results) No results for input(s): PROBNP in the last 8760 hours. HbA1C: No results for input(s): HGBA1C in the last 72 hours. CBG: Recent Labs  Lab 02/16/17 1146 02/16/17 1606 02/16/17 2147 02/17/17 0756 02/17/17 1204  GLUCAP 136* 156* 231* 131* 151*   Lipid Profile: No results for input(s): CHOL, HDL, LDLCALC, TRIG, CHOLHDL, LDLDIRECT in the last 72 hours. Thyroid Function Tests: No results for input(s): TSH, T4TOTAL, FREET4, T3FREE, THYROIDAB in the last 72 hours. Anemia Panel: No results for input(s): VITAMINB12, FOLATE, FERRITIN, TIBC, IRON, RETICCTPCT in the last 72 hours. Sepsis Labs: Recent Labs  Lab 02/15/17 0709  LATICACIDVEN 1.34    Recent Results (from the past 240 hour(s))  MRSA PCR Screening     Status: None   Collection Time: 02/16/17 12:32 AM  Result Value Ref Range Status   MRSA by PCR NEGATIVE NEGATIVE Final    Comment:        The GeneXpert MRSA Assay (FDA approved for NASAL specimens only), is one component of a comprehensive MRSA colonization surveillance program. It is not intended to diagnose MRSA infection nor to guide or monitor treatment for MRSA infections. Performed at [facility], [address], [place], [place] [address]   Surgical pcr screen     Status: None   Collection Time: 02/17/17  5:41 AM  Result Value Ref Range Status   MRSA, PCR NEGATIVE NEGATIVE Final   Staphylococcus aureus NEGATIVE NEGATIVE Final    Comment: (NOTE) The Xpert SA Assay (FDA approved for NASAL  specimens in patients 8 years of age and older), is one component of a comprehensive surveillance program. It is not intended to diagnose infection nor to guide or monitor treatment. Performed at [facility], [address] [place], [place] [address]          Radiology Studies: Dg C-arm 1-60 Min  Result Date: 02/18/2017 CLINICAL DATA:  Right intertrochanteric femur ORIF. EXAM: OPERATIVE RIGHT HIP (WITH PELVIS IF PERFORMED) TECHNIQUE: Fluoroscopic spot image(s) were submitted for interpretation post-operatively. Fluoroscopy time is 1 minute, 17 seconds. COMPARISON:  Right hip x-rays dated February 15, 2017. FINDINGS: Intraoperative x-rays demonstrate interval short cephalomedullary nail fixation of the right intertrochanteric femur fracture. Alignment is near anatomic. No evidence of hardware complication. IMPRESSION: Right intertrochanteric femur fracture ORIF in near anatomic alignment. Electronically Signed   By: Obie Dredge M.D.   On: 02/18/2017 15:44   Dg Hip Port Unilat With Pelvis 1v Right  Result Date: 02/18/2017 CLINICAL DATA:  Postoperative status post ORIF right proximal femur fracture EXAM: DG HIP (WITH OR WITHOUT PELVIS) 1V PORT RIGHT COMPARISON:  02/15/2017 right hip radiograph FINDINGS: Comminuted intertrochanteric right proximal femur fracture is in near-anatomic alignment status post transfixation by intramedullary rod with interlocking right femoral neck pin and distal interlocking screw. No right hip dislocation. 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 No suspicious focal osseous lesions. Expected soft tissue emphysema surrounding surgical repair site in the right hip and thigh. IMPRESSION: Satisfactory immediate postoperative appearance status post ORIF intertrochanteric proximal right femur fracture, in near anatomic alignment. Electronically Signed   By: Delbert Phenix M.D.   On: 02/18/2017 18:03   Dg Hip Operative Unilat W Or W/o Pelvis  Right  Result Date: 02/18/2017 CLINICAL DATA:  Right intertrochanteric femur ORIF. EXAM: OPERATIVE RIGHT HIP (WITH PELVIS IF PERFORMED) TECHNIQUE: Fluoroscopic spot image(s) were submitted for interpretation post-operatively. Fluoroscopy time is 1 minute, 17 seconds. COMPARISON:  Right hip x-rays dated February 15, 2017. FINDINGS: Intraoperative x-rays demonstrate interval short cephalomedullary nail fixation of the right intertrochanteric femur fracture. Alignment is near anatomic. No evidence of hardware complication. IMPRESSION: Right intertrochanteric femur fracture  ORIF in near anatomic alignment. Electronically Signed   By: Obie DredgeWilliam T Derry M.D.   On: 02/18/2017 15:44        Scheduled Meds: . folic acid  1 mg Oral Daily  . mouth rinse  15 mL Mouth Rinse BID  . multivitamin with minerals  1 tablet Oral Daily  . pantoprazole (PROTONIX) IV  40 mg Intravenous Q12H  . sodium chloride flush  3 mL Intravenous Q12H  . thiamine  100 mg Oral Daily   Or  . thiamine  100 mg Intravenous Daily   Continuous Infusions: . aztreonam Stopped (02/19/17 [id number])  . lactated ringers 10 mL/hr at 02/18/17 1332  . octreotide  (SANDOSTATIN)    IV infusion 50 mcg/hr (02/18/17 1728)     LOS: 4 days        Glade LloydKshitiz Xavia Kniskern, MD [facility] Pager [phone]  If 7PM-7AM, please contact night-coverage [email or web address] Password [email or web address] 02/19/2017, 10:01 AM

## 2017-02-19 NOTE — Anesthesia Postprocedure Evaluation (Signed)
Anesthesia Post Note  Patient: Debbie Barker  Procedure(s) Performed: ESOPHAGOGASTRODUODENOSCOPY (EGD) WITH PROPOFOL (N/A )     Patient location during evaluation: PACU Anesthesia Type: MAC Level of consciousness: awake and alert Pain management: pain level controlled Vital Signs Assessment: post-procedure vital signs reviewed and stable Respiratory status: spontaneous breathing, nonlabored ventilation, respiratory function stable and patient connected to nasal cannula oxygen Cardiovascular status: stable and blood pressure returned to baseline Postop Assessment: no apparent nausea or vomiting Anesthetic complications: no    Last Vitals:  Vitals:   02/19/17 0044 02/19/17 0548  BP: (!) 94/59 (!) 102/53  Pulse:  73  Resp:  14  Temp:  36.9 C  SpO2:  97%    Last Pain:  Vitals:   02/19/17 0548  TempSrc: Oral  PainSc:                  Heyworth S

## 2017-02-19 NOTE — Progress Notes (Addendum)
Patient operative site has been bleeding through the dressing.  Patient blood pressure has been trending down since she arrived to the floor from surgery.  Dressing has been reinforced.  Notified the MD.  Will continue to monitor the patient and notify as needed.

## 2017-02-19 NOTE — Progress Notes (Signed)
Peripherally Inserted Central Catheter/Midline Placement  The IV Nurse has discussed with the patient and/or persons authorized to consent for the patient, the purpose of this procedure and the potential benefits and risks involved with this procedure.  The benefits include less needle sticks, lab draws from the catheter, and the patient may be discharged home with the catheter. Risks include, but not limited to, infection, bleeding, blood clot (thrombus formation), and puncture of an artery; nerve damage and irregular heartbeat and possibility to perform a PICC exchange if needed/ordered by physician.  Alternatives to this procedure were also discussed.  Bard Power PICC patient education guide, fact sheet on infection prevention and patient information card has been provided to patient /or left at bedside.  Consent signed by brother due to altered mental status.  PICC/Midline Placement Documentation  PICC Double Lumen 02/19/17 PICC Right Basilic 40 cm 1 cm (Active)  Indication for Insertion or Continuance of Line Poor Vasculature-patient has had multiple peripheral attempts or PIVs lasting less than 24 hours 02/19/2017  7:36 PM  Exposed Catheter (cm) 1 cm 02/19/2017  7:36 PM  Site Assessment Clean;Dry;Intact 02/19/2017  7:36 PM  Lumen #1 Status Flushed;Saline locked;Blood return noted 02/19/2017  7:36 PM  Lumen #2 Status Flushed;Saline locked;Blood return noted 02/19/2017  7:36 PM  Dressing Type Transparent 02/19/2017  7:36 PM  Dressing Status Clean;Dry;Intact;Antimicrobial disc in place 02/19/2017  7:36 PM  Dressing Change Due 02/26/17 02/19/2017  7:36 PM       Debbie Barker, Lajean ManesKerry Loraine 02/19/2017, 7:37 PM

## 2017-02-19 NOTE — Progress Notes (Signed)
[facility] PROGRESS NOTE Subjective patient has history of hepatitis C and alcohol abuse with cirrhosis came in with variceal bleed underwent EGD 3 days ago with banding of esophageal Pharisees with finding also severe portal gastropathy. His on octreotide. Had repair of the right intertrochanteric femur fracture 2/14.  Objective: Vital signs in last 24 hours: Temp:  [97.5 F (36.4 C)-98.4 F (36.9 C)] 98.4 F (36.9 C) (02/18 0548) Pulse Rate:  [73-92] 73 (02/18 0548) Resp:  [11-25] 14 (02/18 0548) BP: (92-153)/(53-96) 102/53 (02/18 0548) SpO2:  [89 %-100 %] 97 % (02/18 0548) Last BM Date: 02/18/17  Intake/Output from previous day: 02/17 0701 - 02/18 0700 In: 1645.2 [P.O.:120; I.V.:1275.2; IV Piggyback:250] Out: 200 [Blood:200] Intake/Output this shift: No intake/output data recorded.  PE: General--confused   Lab Results: Recent Labs    02/17/17 0312  02/17/17 1348 02/17/17 1747 02/18/17 0749 02/18/17 1031 02/19/17 0124 02/19/17 1216  WBC 10.8*  --   --   --   --  8.2 9.5  --   HGB 7.6*   < > 8.7* 8.0* 8.4* 9.2* 7.6* 7.5*  HCT 22.4*   < > 25.6* 23.7*  --  28.2* 23.8* 22.7*  PLT 173  --   --   --   --  109* 96*  --    < > = values in this interval not displayed.   BMET Recent Labs    02/17/17 0039 02/18/17 1031 02/19/17 0124  NA 130* 133* 135  K 3.9 4.2 3.9  CL 104 107 107  CO2 19* 18* 21*  CREATININE 1.63* 1.25* 1.44*   LFT Recent Labs    02/17/17 0312 02/18/17 1031 02/19/17 0124  PROT 6.1* 6.2* 5.9*  AST 296* 162* 133*  ALT 254* 190* 164*  ALKPHOS 97 100 93  BILITOT 7.7* 6.4* 5.7*  BILIDIR 3.9*  --   --   IBILI 3.8*  --   --    PT/INR Recent Labs    02/17/17 0312 02/18/17 1031  LABPROT 22.9* 21.9*  INR 2.04 1.93   PANCREAS No results for input(s): LIPASE in the last 72 hours.       Studies/Results: Dg C-arm 1-60 Min  Result Date: 02/18/2017 CLINICAL DATA:  Right intertrochanteric femur ORIF. EXAM: OPERATIVE RIGHT HIP  (WITH PELVIS IF PERFORMED) TECHNIQUE: Fluoroscopic spot image(s) were submitted for interpretation post-operatively. Fluoroscopy time is 1 minute, 17 seconds. COMPARISON:  Right hip x-rays dated February 15, 2017. FINDINGS: Intraoperative x-rays demonstrate interval short cephalomedullary nail fixation of the right intertrochanteric femur fracture. Alignment is near anatomic. No evidence of hardware complication. IMPRESSION: Right intertrochanteric femur fracture ORIF in near anatomic alignment. Electronically Signed   By: Obie DredgeWilliam T Derry M.D.   On: 02/18/2017 15:44   Dg Hip Port Unilat With Pelvis 1v Right  Result Date: 02/18/2017 CLINICAL DATA:  Postoperative status post ORIF right proximal femur fracture EXAM: DG HIP (WITH OR WITHOUT PELVIS) 1V PORT RIGHT COMPARISON:  02/15/2017 right hip radiograph FINDINGS: Comminuted intertrochanteric right proximal femur fracture is in near-anatomic alignment status post transfixation by intramedullary rod with interlocking right femoral neck pin and distal interlocking screw. No right hip dislocation. 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 No suspicious focal osseous lesions. Expected soft tissue emphysema surrounding surgical repair site in the right hip and thigh. IMPRESSION: Satisfactory immediate postoperative appearance status post ORIF intertrochanteric proximal right femur fracture, in near anatomic alignment. Electronically Signed   By: Delbert PhenixJason A Poff M.D.   On: 02/18/2017 18:03   Dg Hip  Operative Unilat W Or W/o Pelvis Right  Result Date: 02/18/2017 CLINICAL DATA:  Right intertrochanteric femur ORIF. EXAM: OPERATIVE RIGHT HIP (WITH PELVIS IF PERFORMED) TECHNIQUE: Fluoroscopic spot image(s) were submitted for interpretation post-operatively. Fluoroscopy time is 1 minute, 17 seconds. COMPARISON:  Right hip x-rays dated February 15, 2017. FINDINGS: Intraoperative x-rays demonstrate interval short cephalomedullary nail fixation of the right intertrochanteric  femur fracture. Alignment is near anatomic. No evidence of hardware complication. IMPRESSION: Right intertrochanteric femur fracture ORIF in near anatomic alignment. Electronically Signed   By: Obie Dredge M.D.   On: 02/18/2017 15:44    Medications: I have reviewed the patient's current medications.  Assessment:   1. Cirrhosis with variceal bleeding. Patient appears stable no further bleeding. She is on octreotide as well as IV pantoprazole. Hemoglobin basically stable, bilirubin remains at 6   Plan: would continue octreotide for full 5 days IV. She will need to be on BID Protonix for at least 2 to 3 weeks. We will continue to follow her in the hospital.   Tresea Mall 02/19/2017, 1:42 PM  This note was created using voice recognition software. Minor errors may Have occurred unintentionally.  Pager: [phone] If no answer or after hours call [phone]

## 2017-02-20 ENCOUNTER — Encounter (HOSPITAL_COMMUNITY): Payer: Self-pay | Admitting: Orthopaedic Surgery

## 2017-02-20 LAB — BASIC METABOLIC PANEL
ANION GAP: 7 (ref 5–15)
BUN: 20 mg/dL (ref 6–20)
CO2: 20 mmol/L — AB (ref 22–32)
CREATININE: 1.44 mg/dL — AB (ref 0.44–1.00)
Calcium: 7.4 mg/dL — ABNORMAL LOW (ref 8.9–10.3)
Chloride: 107 mmol/L (ref 101–111)
GFR, EST AFRICAN AMERICAN: 49 mL/min — AB (ref >60)
GFR, EST NON AFRICAN AMERICAN: 42 mL/min — AB (ref >60)
Glucose, Bld: 105 mg/dL — ABNORMAL HIGH (ref 65–99)
Potassium: 3.6 mmol/L (ref 3.5–5.1)
Sodium: 134 mmol/L — ABNORMAL LOW (ref 135–145)

## 2017-02-20 LAB — CBC
HEMATOCRIT: 22.2 % — AB (ref 36.0–46.0)
Hemoglobin: 7.3 g/dL — ABNORMAL LOW (ref 12.0–15.0)
MCH: 30.3 pg (ref 26.0–34.0)
MCHC: 32.9 g/dL (ref 30.0–36.0)
MCV: 92.1 fL (ref 78.0–100.0)
Platelets: 132 10*3/uL — ABNORMAL LOW (ref 150–400)
RBC: 2.41 MIL/uL — ABNORMAL LOW (ref 3.87–5.11)
RDW: 21.6 % — AB (ref 11.5–15.5)
WBC: 12.5 10*3/uL — AB (ref 4.0–10.5)

## 2017-02-20 LAB — HCV RNA QUANT RFLX ULTRA OR GENOTYP
HCV RNA QNT(LOG COPY/ML): 3.898 {Log_IU}/mL
HepC Qn: 7900 IU/mL

## 2017-02-20 LAB — CMV DNA BY PCR, QUALITATIVE: CMV DNA, Qual PCR: NEGATIVE

## 2017-02-20 LAB — AMMONIA: Ammonia: 88 umol/L — ABNORMAL HIGH (ref 9–35)

## 2017-02-20 LAB — HEPATITIS C GENOTYPE: Hepatitis C Genotype: 3

## 2017-02-20 NOTE — Progress Notes (Addendum)
Patient ID: Debbie Barker, female   DOB: January 12, 1970, 47 y.o.   MRN: [id number]  PROGRESS NOTE    Armie Moren  NWG:[id number] DOB: 1970/05/31 DOA: 02/15/2017 PCP: Lavinia Sharps, NP   Brief Narrative:  47 year old female with history of alcoholic cirrhosis, esophageal varices with prior GI bleeds, ascites, chronic pancreatitis, active alcohol abuse, depression presented on 02/15/2017 with fall and severe leg pain in the setting of presyncopal episode after several days of hematemesis.  She was found to have intertrochanteric right hip fracture.  Orthopedics was consulted.  She was initially found to have hemoglobin of 3.2 for which she was given blood transfusion.  She was started on octreotide and Protonix.  GI was consulted.  She underwent EGD on 02/16/2017 with 4 bands placement.  Patient was transferred to [facility] for further orthopedic intervention.   Assessment & Plan:   Principal Problem:   Acute upper GI bleeding Active Problems:   Alcoholic cirrhosis of liver with ascites (HCC)   Secondary esophageal varices with bleeding (HCC)   AKI (acute kidney injury) (HCC)   Alcohol abuse   Acute on chronic alcoholic liver disease (HCC)   Acute upper GI bleed   Thrombocytopenia (HCC)   Acute hepatitis C virus infection without hepatic coma   Acute blood loss anemia   Anemia of chronic disease   Benign essential HTN   Ascites due to alcoholic cirrhosis (HCC)   Pain   Acute GI bleeding   Acute blood loss anemia probably from upper GI bleeding -Hemoglobin 7.3 this morning.  Status post 4 units of packed red cells transfusion since admission.  We will hold off on transfusion unless there is active bleeding or if hemoglobin is below 7. -Monitor H&H.  No overnight hematemesis or melena  Upper GI bleeding probably secondary to bleeding esophageal varices status post banding -Continue octreotide for a total of 5 days as per GI.  Continue Protonix.  GI following  Decompensated  cirrhosis of liver probably due to combination of alcohol use and history of hepatitis C with ascites and elevated LFTs and mild coagulopathy -GI following.  Continue antibiotics empirically for SBP prophylaxis. -Not a candidate for liver transplant because of ongoing alcohol abuse -Trend LFTs and INR  Probable hepatic encephalopathy -Continue lactulose.  Patient is having loose bowel movements.  Thrombocytopenia -Probably secondary to decompensated cirrhosis of liver -Monitor  Right hip intertrochanteric fracture status post fall -Orthopedics following.  Status post surgery on 02/18/2017.  Wound care and dressing changes as per orthopedics -Fall precautions -Patient cannot be put on Lovenox for DVT prophylaxis because of coagulopathy  Alcohol abuse -Continue CIWA protocol.  Avoid benzodiazepines as much as possible because of hepatic encephalopathy.  Continue thiamine, folate and multivitamin  Hypomagnesemia -Follow am labs  Hypoalbuminemia -Probably second to poor oral intake -Nutrition consult  DVT prophylaxis: SCDs Code Status: DNR Family Communication: Spoke to brother at bedside Disposition Plan: Depends on clinical outcome  Consultants: Orthopedics and GI  Procedures:  EGD on 02/16/2017 showed large esophageal varices with stigmata of recent bleeding. 4 bands placed. 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  02/18/2017: Right intertrochanteric intramedullary nailing  Antimicrobials:  Azactam from 02/15/2017 onwards  Subjective: Seen and examined at bedside.  She is more awake today but still very drowsy and hardly answers any questions.  No overnight fever or vomiting.  As per the nursing staff, patient is having multiple loose bowel movements which are not melanotic or bloody.    Objective:  Vitals:   02/19/17 0548 02/19/17 1411 02/19/17 2201 02/20/17 0545  BP: (!) 102/53 (!) 110/47 (!) 100/45 107/69  Pulse: 73 93 (!) 109 (!) 104  Resp: 14 18 16 18    Temp: 98.4 F (36.9 C) 97.9 F (36.6 C) 99.8 F (37.7 C) 99.5 F (37.5 C)  TempSrc: Oral Oral Oral Oral  SpO2: 97% 96% 100% 95%  Weight:      Height:        Intake/Output Summary (Last 24 hours) at 02/20/2017 1231 Last data filed at 02/20/2017 1018 Gross per 24 hour  Intake 240 ml  Output -  Net 240 ml   Filed Weights   02/15/17 2106 02/16/17 0045 02/17/17 1858  Weight: 72.6 kg (160 lb) 62.3 kg (137 lb 5.6 oz) 69.8 kg (153 lb 14.1 oz)    Examination:  General exam: More awake today but very drowsy and confused.  Respiratory system: Bilateral decreased breath sounds at bases  cardiovascular system: S1-S2 positive, rate controlled  Gastrointestinal system: Abdomen is distended, soft and nontender. Normal bowel sounds heard. Extremities: No cyanosis, clubbing, edema    Data Reviewed: I have personally reviewed following labs and imaging studies  CBC: Recent Labs  Lab 02/15/17 0616  02/16/17 0126  02/17/17 0312  02/17/17 1747 02/18/17 0749 02/18/17 1031 02/19/17 0124 02/19/17 1216 02/20/17 0445  WBC 4.7  --  7.7  --  10.8*  --   --   --  8.2 9.5  --  12.5*  NEUTROABS 2.8  --   --   --   --   --   --   --  5.5 6.8  --   --   HGB 3.2*   < > 7.0*   < > 7.6*   < > 8.0* 8.4* 9.2* 7.6* 7.5* 7.3*  HCT 9.8*   < > 20.0*   < > 22.4*   < > 23.7*  --  28.2* 23.8* 22.7* 22.2*  MCV 91.6  --  88.1  --  88.9  --   --   --  89.2 90.2  --  92.1  PLT 97*  --  153  --  173  --   --   --  109* 96*  --  132*   < > = values in this interval not displayed.   Basic Metabolic Panel: Recent Labs  Lab 02/16/17 0126 02/17/17 0039 02/18/17 1031 02/19/17 0124 02/20/17 0445  NA 129* 130* 133* 135 134*  K 4.5 3.9 4.2 3.9 3.6  CL 101 104 107 107 107  CO2 21* 19* 18* 21* 20*  GLUCOSE 119* 148* 106* 103* 105*  BUN 53* 44* 23* 24* 20  CREATININE 1.42* 1.63* 1.25* 1.44* 1.44*  CALCIUM 7.5* 7.7* 7.8* 7.7* 7.4*  MG 2.1  --  1.4* 1.8  --    GFR: Estimated Creatinine Clearance: 48.7 mL/min  (A) (by C-G formula based on SCr of 1.44 mg/dL (H)). Liver Function Tests: Recent Labs  Lab 02/15/17 0616 02/16/17 0126 02/17/17 0312 02/18/17 1031 02/19/17 0124  AST 1,069* 714* 296* 162* 133*  ALT 360* 350* 254* 190* 164*  ALKPHOS 86 106 97 100 93  BILITOT 4.1* 9.1* 7.7* 6.4* 5.7*  PROT 5.3* 6.3* 6.1* 6.2* 5.9*  ALBUMIN 1.7* 2.0* 1.9* 1.9* 1.8*   Recent Labs  Lab 02/15/17 0616  LIPASE 73*   Recent Labs  Lab 02/15/17 0644 02/19/17 0935 02/20/17 0445  AMMONIA 26 60* 88*   Coagulation Profile: Recent Labs  Lab 02/15/17  9604 02/16/17 0126 02/17/17 0312 02/18/17 1031  INR 2.65 2.13 2.04 1.93   Cardiac Enzymes: No results for input(s): CKTOTAL, CKMB, CKMBINDEX, TROPONINI in the last 168 hours. BNP (last 3 results) No results for input(s): PROBNP in the last 8760 hours. HbA1C: No results for input(s): HGBA1C in the last 72 hours. CBG: Recent Labs  Lab 02/16/17 1146 02/16/17 1606 02/16/17 2147 02/17/17 0756 02/17/17 1204  GLUCAP 136* 156* 231* 131* 151*   Lipid Profile: No results for input(s): CHOL, HDL, LDLCALC, TRIG, CHOLHDL, LDLDIRECT in the last 72 hours. Thyroid Function Tests: No results for input(s): TSH, T4TOTAL, FREET4, T3FREE, THYROIDAB in the last 72 hours. Anemia Panel: No results for input(s): VITAMINB12, FOLATE, FERRITIN, TIBC, IRON, RETICCTPCT in the last 72 hours. Sepsis Labs: Recent Labs  Lab 02/15/17 0709  LATICACIDVEN 1.34    Recent Results (from the past 240 hour(s))  MRSA PCR Screening     Status: None   Collection Time: 02/16/17 12:32 AM  Result Value Ref Range Status   MRSA by PCR NEGATIVE NEGATIVE Final    Comment:        The GeneXpert MRSA Assay (FDA approved for NASAL specimens only), is one component of a comprehensive MRSA colonization surveillance program. It is not intended to diagnose MRSA infection nor to guide or monitor treatment for MRSA infections. Performed at [facility], [address], [place], [place] [address]   Surgical pcr screen     Status: None   Collection Time: 02/17/17  5:41 AM  Result Value Ref Range Status   MRSA, PCR NEGATIVE NEGATIVE Final   Staphylococcus aureus NEGATIVE NEGATIVE Final    Comment: (NOTE) The Xpert SA Assay (FDA approved for NASAL specimens in patients 36 years of age and older), is one component of a comprehensive surveillance program. It is not intended to diagnose infection nor to guide or monitor treatment. Performed at [facility], [address] [place], [place] [address]          Radiology Studies: Dg C-arm 1-60 Min  Result Date: 02/18/2017 CLINICAL DATA:  Right intertrochanteric femur ORIF. EXAM: OPERATIVE RIGHT HIP (WITH PELVIS IF PERFORMED) TECHNIQUE: Fluoroscopic spot image(s) were submitted for interpretation post-operatively. Fluoroscopy time is 1 minute, 17 seconds. COMPARISON:  Right hip x-rays dated February 15, 2017. FINDINGS: Intraoperative x-rays demonstrate interval short cephalomedullary nail fixation of the right intertrochanteric femur fracture. Alignment is near anatomic. No evidence of hardware complication. IMPRESSION: Right intertrochanteric femur fracture ORIF in near anatomic alignment. Electronically Signed   By: Obie Dredge M.D.   On: 02/18/2017 15:44   Dg Hip Port Unilat With Pelvis 1v Right  Result Date: 02/18/2017 CLINICAL DATA:  Postoperative status post ORIF right proximal femur fracture EXAM: DG HIP (WITH OR WITHOUT PELVIS) 1V PORT RIGHT COMPARISON:  02/15/2017 right hip radiograph FINDINGS: Comminuted intertrochanteric right proximal femur fracture is in near-anatomic alignment status post transfixation by intramedullary rod with interlocking right femoral neck pin and distal interlocking screw. No right hip dislocation. 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 No suspicious focal osseous lesions. Expected soft tissue emphysema surrounding surgical repair site in the  right hip and thigh. IMPRESSION: Satisfactory immediate postoperative appearance status post ORIF intertrochanteric proximal right femur fracture, in near anatomic alignment. Electronically Signed   By: Delbert Phenix M.D.   On: 02/18/2017 18:03   Dg Hip Operative Unilat W Or W/o Pelvis Right  Result Date: 02/18/2017 CLINICAL DATA:  Right intertrochanteric femur ORIF. EXAM: OPERATIVE RIGHT  HIP (WITH PELVIS IF PERFORMED) TECHNIQUE: Fluoroscopic spot image(s) were submitted for interpretation post-operatively. Fluoroscopy time is 1 minute, 17 seconds. COMPARISON:  Right hip x-rays dated February 15, 2017. FINDINGS: Intraoperative x-rays demonstrate interval short cephalomedullary nail fixation of the right intertrochanteric femur fracture. Alignment is near anatomic. No evidence of hardware complication. IMPRESSION: Right intertrochanteric femur fracture ORIF in near anatomic alignment. Electronically Signed   By: Obie DredgeWilliam T Derry M.D.   On: 02/18/2017 15:44        Scheduled Meds: . folic acid  1 mg Oral Daily  . lactulose  10 g Oral BID  . mouth rinse  15 mL Mouth Rinse BID  . multivitamin with minerals  1 tablet Oral Daily  . pantoprazole (PROTONIX) IV  40 mg Intravenous Q12H  . sodium chloride flush  3 mL Intravenous Q12H  . thiamine  100 mg Oral Daily   Or  . thiamine  100 mg Intravenous Daily   Continuous Infusions: . aztreonam Stopped (02/20/17 0530)  . lactated ringers 10 mL/hr at 02/18/17 1332  . octreotide  (SANDOSTATIN)    IV infusion 50 mcg/hr (02/20/17 0500)     LOS: 5 days        Glade LloydKshitiz Tayia Stonesifer, MD [facility] Pager [phone]  If 7PM-7AM, please contact night-coverage [email or web address] Password [email or web address] 02/20/2017, 12:31 PM

## 2017-02-20 NOTE — Progress Notes (Signed)
Rehab admissions - I met with patient.  She is confused today and not answering all my questions appropriately.  I will try to get in touch with some family to establish baseline status and to determine potential caregiver for post rehab.  Call me for questions.  #[phone]

## 2017-02-20 NOTE — Progress Notes (Signed)
Physical Therapy Treatment Patient Details Name: Marinus MawDena Seaman MRN: [id number] DOB: 07-Jan-1970 Today's Date: 02/20/2017    History of Present Illness Sachiko Bruna [place] is a 47 y.o. female with medical history significant of alcoholic cirrhosis complicated by chronic pancreatitis, esophageal varices with prior GI bleeds, ascites, active alcohol abuse, depression who comes in with fall and severe leg pain in the setting of presyncopal episode after several days of hematemesis. Pt underwent IM nail R femur (though procedure was delayed due to pt's low hgb). Pt [facility].     PT Comments    Pt continues to have decreased cognition and trouble with following single step commands to complete safe mobility. Pt continues to be modAx2 for bed mobility and sit<>stand, maxAx2 for stand step transfer from bed to recliner. Given pt inability to follow commands and decreased progress with safe mobility PT recommends SNF level care at d/c. PT will continue to follow acutely until d/c.    Follow Up Recommendations  SNF     Equipment Recommendations  Rolling walker with 5" wheels    Recommendations for Other Services Rehab consult     Precautions / Restrictions Precautions Precautions: Fall Precaution Comments: pt has had multiple falls at home. Brother present and says there is some question of pt's boyfriend's involvement in some of these falls.  Restrictions Weight Bearing Restrictions: Yes RLE Weight Bearing: Weight bearing as tolerated    Mobility  Bed Mobility Overal bed mobility: Needs Assistance Bed Mobility: Supine to Sit     Supine to sit: Mod assist;+2 for physical assistance     General bed mobility comments: pt able to come to long sitting in bed without assist however recuires modA for bringing LE to floor  Transfers Overall transfer level: Needs assistance Equipment used: Rolling walker (2 wheeled) Transfers: Sit to/from [employer] CorporationStand;Stand Pivot  Transfers Sit to Stand: Mod assist;+2 physical assistance Stand pivot transfers: Mod assist;+2 physical assistance       General transfer comment: mod A for power up, required maximal vc for side stepping to recliner from EoB. Pt picked RW up off ground and carried it despite verbal cues and therapist guidance to floor. Pt required vc for each step and followed inconsistently beginning to sit before in front of recliner  Ambulation/Gait             General Gait Details: pt too lethargic to attempt today         Balance Overall balance assessment: Needs assistance Sitting-balance support: Bilateral upper extremity supported Sitting balance-Leahy Scale: Fair Sitting balance - Comments: able to maintain sitting EOB with close supervision   Standing balance support: Bilateral upper extremity supported Standing balance-Leahy Scale: Poor Standing balance comment: heavily reliant on UE support for pericare in standing                             Cognition Arousal/Alertness: Lethargic;Suspect due to medications Behavior During Therapy: Flat affect Overall Cognitive Status: Impaired/Different from baseline Area of Impairment: Orientation;Memory;Following commands;Problem solving                 Orientation Level: Disoriented to;Time;Situation   Memory: Decreased short-term memory Following Commands: Follows one step commands inconsistently     Problem Solving: Slow processing;Decreased initiation;Requires verbal cues;Requires tactile cues;Difficulty sequencing General Comments: when getting pt up to side of bed pt found to be laying in stool and when asked if she knew she said  yes.              Pertinent Vitals/Pain Pain Assessment: 0-10 Pain Score: 10-Worst pain ever Pain Location: R hip Pain Descriptors / Indicators: Aching;Grimacing;Discomfort Pain Intervention(s): Monitored during session;Repositioned           PT Goals (current goals can now  be found in the care plan section) Acute Rehab PT Goals Patient Stated Goal: none stated PT Goal Formulation: Patient unable to participate in goal setting Time For Goal Achievement: 03/05/17 Potential to Achieve Goals: Good    Frequency    Min 3X/week      PT Plan Discharge plan needs to be updated    Co-evaluation PT/OT/SLP Co-Evaluation/Treatment: Yes            AM-PAC PT "6 Clicks" Daily Activity  Outcome Measure  Difficulty turning over in bed (including adjusting bedclothes, sheets and blankets)?: Unable Difficulty moving from lying on back to sitting on the side of the bed? : Unable Difficulty sitting down on and standing up from a chair with arms (e.g., wheelchair, bedside commode, etc,.)?: Unable Help needed moving to and from a bed to chair (including a wheelchair)?: Total Help needed walking in hospital room?: Total Help needed climbing 3-5 steps with a railing? : Total 6 Click Score: 6    End of Session Equipment Utilized During Treatment: Gait belt Activity Tolerance: Patient limited by lethargy Patient left: in chair;with call bell/phone within reach;with family/visitor present;with chair alarm set Nurse Communication: Mobility status PT Visit Diagnosis: Unsteadiness on feet (R26.81);Muscle weakness (generalized) (M62.81);Repeated falls (R29.6);Pain;Difficulty in walking, not elsewhere classified (R26.2) Pain - Right/Left: Right Pain - part of body: Hip     Time: 6962-9528 PT Time Calculation (min) (ACUTE ONLY): 31 min  Charges:  $Therapeutic Activity: 23-37 mins                    G Codes:       Faithlyn Recktenwald B. Beverely Risen PT, DPT Acute Rehabilitation  [phone] Pager [phone]     Elon Alas Fleet 02/20/2017, 1:26 PM

## 2017-02-20 NOTE — Progress Notes (Signed)
ORTHOPAEDIC PROGRESS NOTE  s/p Procedure(s): INTRAMEDULLARY (IM) NAIL INTERTROCHANTRIC  SUBJECTIVE: Complains of pain  OBJECTIVE: PE: ZOX:[id number]:dressing reinforced, CDI, leg lengths equal, warm well perfused foot, intact EHL/TA/GSC   Vitals:   02/20/17 0545 02/20/17 1353  BP: 107/69 119/72  Pulse: (!) 104 (!) 101  Resp: 18 20  Temp: 99.5 F (37.5 C) 99 F (37.2 C)  SpO2: 95% 95%     ASSESSMENT: Debbie Barker is a 47 y.o. female doing well postoperatively.  PLAN: Weightbearing: WBAT RLE Insicional and dressing care: Dressing will likely continue to occasionally bleed through due to coagulopathy.  Please reinforce as needed.  After 3-4 days can likely take down and leave open. Orthopedic device(s): None Showering: not until dressing change VTE prophylaxis: Not indicated per ortho as patient INR around 2 at baseline.  Defer to medicine  Pain control: PRN meds.  Patient seems to be either sedated or asking for pain medications.  Limited options on non narcotics as she has liver issues as well as a gi bleed.  Recommend just continuing oxycodone and icing area. Follow - up plan: 2 weeks Contact information:  Weekdays 8-5 Ramond Marrowax Kymia Simi MD [phone], After hours and holidays please check [email or web address] for group call information for [facility]

## 2017-02-20 NOTE — Progress Notes (Signed)
[facility] PROGRESS NOTE Subjective no gross bleeding. Patient remains confused. Had to have declined inserted but now octreotide restarted. Hemoglobin dropped a little. She remains somewhat confused.  Objective: Vital signs in last 24 hours: Temp:  [97.9 F (36.6 C)-99.8 F (37.7 C)] 99.5 F (37.5 C) (02/19 0545) Pulse Rate:  [93-109] 104 (02/19 0545) Resp:  [16-18] 18 (02/19 0545) BP: (100-110)/(45-69) 107/69 (02/19 0545) SpO2:  [95 %-100 %] 95 % (02/19 0545) Last BM Date: 02/20/17  Intake/Output from previous day: 02/18 0701 - 02/19 0700 In: 120 [P.O.:110; I.V.:10] Out: -  Intake/Output this shift: Total I/O In: 120 [P.O.:120] Out: -   PE: General-- no distress to very confused just had her lactulose.   Lab Results: Recent Labs    02/17/17 1747 02/18/17 0749 02/18/17 1031 02/19/17 0124 02/19/17 1216 02/20/17 0445  WBC  --   --  8.2 9.5  --  12.5*  HGB 8.0* 8.4* 9.2* 7.6* 7.5* 7.3*  HCT 23.7*  --  28.2* 23.8* 22.7* 22.2*  PLT  --   --  109* 96*  --  132*   BMET Recent Labs    02/18/17 1031 02/19/17 0124 02/20/17 0445  NA 133* 135 134*  K 4.2 3.9 3.6  CL 107 107 107  CO2 18* 21* 20*  CREATININE 1.25* 1.44* 1.44*   LFT Recent Labs    02/18/17 1031 02/19/17 0124  PROT 6.2* 5.9*  AST 162* 133*  ALT 190* 164*  ALKPHOS 100 93  BILITOT 6.4* 5.7*   PT/INR Recent Labs    02/18/17 1031  LABPROT 21.9*  INR 1.93   PANCREAS No results for input(s): LIPASE in the last 72 hours.       Studies/Results: Dg C-arm 1-60 Min  Result Date: 02/18/2017 CLINICAL DATA:  Right intertrochanteric femur ORIF. EXAM: OPERATIVE RIGHT HIP (WITH PELVIS IF PERFORMED) TECHNIQUE: Fluoroscopic spot image(s) were submitted for interpretation post-operatively. Fluoroscopy time is 1 minute, 17 seconds. COMPARISON:  Right hip x-rays dated February 15, 2017. FINDINGS: Intraoperative x-rays demonstrate interval short cephalomedullary nail fixation of the right  intertrochanteric femur fracture. Alignment is near anatomic. No evidence of hardware complication. IMPRESSION: Right intertrochanteric femur fracture ORIF in near anatomic alignment. Electronically Signed   By: Obie DredgeWilliam T Derry M.D.   On: 02/18/2017 15:44   Dg Hip Port Unilat With Pelvis 1v Right  Result Date: 02/18/2017 CLINICAL DATA:  Postoperative status post ORIF right proximal femur fracture EXAM: DG HIP (WITH OR WITHOUT PELVIS) 1V PORT RIGHT COMPARISON:  02/15/2017 right hip radiograph FINDINGS: Comminuted intertrochanteric right proximal femur fracture is in near-anatomic alignment status post transfixation by intramedullary rod with interlocking right femoral neck pin and distal interlocking screw. No right hip dislocation. 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 No suspicious focal osseous lesions. Expected soft tissue emphysema surrounding surgical repair site in the right hip and thigh. IMPRESSION: Satisfactory immediate postoperative appearance status post ORIF intertrochanteric proximal right femur fracture, in near anatomic alignment. Electronically Signed   By: Delbert PhenixJason A Poff M.D.   On: 02/18/2017 18:03   Dg Hip Operative Unilat W Or W/o Pelvis Right  Result Date: 02/18/2017 CLINICAL DATA:  Right intertrochanteric femur ORIF. EXAM: OPERATIVE RIGHT HIP (WITH PELVIS IF PERFORMED) TECHNIQUE: Fluoroscopic spot image(s) were submitted for interpretation post-operatively. Fluoroscopy time is 1 minute, 17 seconds. COMPARISON:  Right hip x-rays dated February 15, 2017. FINDINGS: Intraoperative x-rays demonstrate interval short cephalomedullary nail fixation of the right intertrochanteric femur fracture. Alignment is near anatomic. No evidence of hardware complication.  IMPRESSION: Right intertrochanteric femur fracture ORIF in near anatomic alignment. Electronically Signed   By: Obie Dredge M.D.   On: 02/18/2017 15:44    Medications: I have reviewed the patient's current  medications.  Assessment:   1. Variceal bleed/cirrhosis. Patient seems to be relatively stable. No gross bleeding is on octreotide 2. Hepatic encephalopathy is on lactulose.   Plan: 1. Should be able to stop the octreotide tomorrow. At that point in time she would've had a full 5 days. Would continue Protonix. Orally and IV if needed. She will definitely need lactulose for encephalopathy. 2. If she remains stable she could have a repeat EGD and banding 3 to 4 weeks.    Debbie Barker 02/20/2017, 11:13 AM  This note was created using voice recognition software. Minor errors may Have occurred unintentionally.  Pager: [phone] If no answer or after hours call [phone]

## 2017-02-21 LAB — CBC
HCT: 21.5 % — ABNORMAL LOW (ref 36.0–46.0)
Hemoglobin: 7 g/dL — ABNORMAL LOW (ref 12.0–15.0)
MCH: 30.6 pg (ref 26.0–34.0)
MCHC: 32.6 g/dL (ref 30.0–36.0)
MCV: 93.9 fL (ref 78.0–100.0)
Platelets: 94 10*3/uL — ABNORMAL LOW (ref 150–400)
RBC: 2.29 MIL/uL — ABNORMAL LOW (ref 3.87–5.11)
RDW: 22.3 % — ABNORMAL HIGH (ref 11.5–15.5)
WBC: 13 10*3/uL — ABNORMAL HIGH (ref 4.0–10.5)

## 2017-02-21 LAB — COMPREHENSIVE METABOLIC PANEL
ALT: 94 U/L — ABNORMAL HIGH (ref 14–54)
AST: 74 U/L — AB (ref 15–41)
Albumin: 1.6 g/dL — ABNORMAL LOW (ref 3.5–5.0)
Alkaline Phosphatase: 91 U/L (ref 38–126)
Anion gap: 8 (ref 5–15)
BUN: 20 mg/dL (ref 6–20)
CHLORIDE: 109 mmol/L (ref 101–111)
CO2: 20 mmol/L — AB (ref 22–32)
Calcium: 7.8 mg/dL — ABNORMAL LOW (ref 8.9–10.3)
Creatinine, Ser: 1.38 mg/dL — ABNORMAL HIGH (ref 0.44–1.00)
GFR, EST AFRICAN AMERICAN: 52 mL/min — AB (ref >60)
GFR, EST NON AFRICAN AMERICAN: 45 mL/min — AB (ref >60)
Glucose, Bld: 138 mg/dL — ABNORMAL HIGH (ref 65–99)
Potassium: 3.4 mmol/L — ABNORMAL LOW (ref 3.5–5.1)
SODIUM: 137 mmol/L (ref 135–145)
Total Bilirubin: 4.3 mg/dL — ABNORMAL HIGH (ref 0.3–1.2)
Total Protein: 5.5 g/dL — ABNORMAL LOW (ref 6.5–8.1)

## 2017-02-21 LAB — PROTIME-INR
INR: 2.19
PROTHROMBIN TIME: 24.2 s — AB (ref 11.4–15.2)

## 2017-02-21 LAB — MAGNESIUM: Magnesium: 1.5 mg/dL — ABNORMAL LOW (ref 1.7–2.4)

## 2017-02-21 MED ORDER — ENSURE ENLIVE PO LIQD
237.0000 mL | Freq: Two times a day (BID) | ORAL | Status: DC
  Administered 2017-02-21 – 2017-02-26 (×9): 237 mL via ORAL

## 2017-02-21 NOTE — Progress Notes (Signed)
[facility] PROGRESS NOTE Subjective patient without any gross bleeding  Objective: Vital signs in last 24 hours: Temp:  [98.3 F (36.8 C)-99.3 F (37.4 C)] 98.3 F (36.8 C) (02/20 0441) Pulse Rate:  [101-113] 103 (02/20 0441) Resp:  [18-20] 18 (02/20 0441) BP: (92-119)/(55-77) 92/55 (02/20 0441) SpO2:  [95 %-97 %] 97 % (02/20 0441) Last BM Date: 02/21/17  Intake/Output from previous day: 02/19 0701 - 02/20 0700 In: 130 [P.O.:120; I.V.:10] Out: 250 [Stool:250] Intake/Output this shift: Total I/O In: 100 [P.O.:100] Out: -   PE: General-- much more alert today answers questions. Says she's not hungry   Lab Results: Recent Labs    02/19/17 0124 02/19/17 1216 02/20/17 0445 02/21/17 0444  WBC 9.5  --  12.5* 13.0*  HGB 7.6* 7.5* 7.3* 7.0*  HCT 23.8* 22.7* 22.2* 21.5*  PLT 96*  --  132* 94*   BMET Recent Labs    02/19/17 0124 02/20/17 0445 02/21/17 0444  NA 135 134* 137  K 3.9 3.6 3.4*  CL 107 107 109  CO2 21* 20* 20*  CREATININE 1.44* 1.44* 1.38*   LFT Recent Labs    02/19/17 0124 02/21/17 0444  PROT 5.9* 5.5*  AST 133* 74*  ALT 164* 94*  ALKPHOS 93 91  BILITOT 5.7* 4.3*   PT/INR Recent Labs    02/21/17 0444  LABPROT 24.2*  INR 2.19   PANCREAS No results for input(s): LIPASE in the last 72 hours.       Studies/Results: No results found.  Medications: I have reviewed the patient's current medications.  Assessment:   1. Cirrhosis with variceal bleed. Patient is banded and has been on octreotide at least 5 days. Her encephalopathy does appear to be improving. Long discussion with her about the need to quit drinking.   Plan: at this point I think we can go ahead and stop the octreotide and observe. Would have her follow-up with Dr. Levora AngelBrahmbhatt for repeat EGD and banding and approximately 4 to 6 weeks. Clearly, alcohol rehab will be very important. We will sign off but will be happy to see her again if there are any acute  problems while in the hospital.   Tresea MallJames L Hang Ammon 02/21/2017, 12:03 PM  This note was created using voice recognition software. Minor errors may Have occurred unintentionally.  Pager: [phone] If no answer or after hours call [phone]

## 2017-02-21 NOTE — NC FL2 (Signed)
[place] MEDICAID FL2 LEVEL OF CARE SCREENING TOOL     IDENTIFICATION  Patient Name: Debbie Barker Birthdate: Sep 05, 1970 Sex: female Admission Date (Current Location): 02/15/2017  [facility] and [place] Number:  Producer, television/film/video and Address:  [facility], [address] [place], [place] [address]      Provider Number: [id number]  Attending Physician Name and Address:  Standley Brooking, MD  Relative Name and Phone Number:  Verlon Au [phone]    Current Level of Care: Hospital Recommended Level of Care: Skilled Nursing Facility Prior Approval Number:    Date Approved/Denied:   PASRR Number: [id number]  Discharge Plan: SNF    Current Diagnoses: Patient Active Problem List   Diagnosis Date Noted  . Thrombocytopenia (HCC)   . Acute hepatitis C virus infection without hepatic coma   . Acute blood loss anemia   . Anemia of chronic disease   . Benign essential HTN   . Ascites due to alcoholic cirrhosis (HCC)   . Pain   . Acute GI bleeding   . Alcoholic cirrhosis of liver with ascites (HCC) 02/15/2017  . Secondary esophageal varices with bleeding (HCC) 02/15/2017  . AKI (acute kidney injury) (HCC) 02/15/2017  . Alcohol abuse 02/15/2017  . Acute upper GI bleeding 02/15/2017  . Acute on chronic alcoholic liver disease (HCC) 02/15/2017  . Acute upper GI bleed 02/15/2017  . Chronic pancreatitis (HCC)     Orientation RESPIRATION BLADDER Height & Weight     Self  Normal Incontinent, External catheter Weight: 69.8 kg (153 lb 14.1 oz) Height:  5\' 8"  (172.7 cm)  BEHAVIORAL SYMPTOMS/MOOD NEUROLOGICAL BOWEL NUTRITION STATUS      Incontinent Diet(Please see DC Summary)  AMBULATORY STATUS COMMUNICATION OF NEEDS Skin   Extensive Assist Verbally Surgical wounds(Closed incision on thigh)                       Personal Care Assistance Level of Assistance  Bathing, Feeding, Dressing Bathing Assistance: Limited assistance Feeding assistance:  Limited assistance Dressing Assistance: Limited assistance     Functional Limitations Info             SPECIAL CARE FACTORS FREQUENCY  PT (By licensed PT), OT (By licensed OT)     PT Frequency: 5x/week OT Frequency: 3x/week            Contractures      Additional Factors Info  Code Status, Allergies Code Status Info: DNR Allergies Info: Penicillins           Current Medications (02/21/2017):  This is the current hospital active medication list Current Facility-Administered Medications  Medication Dose Route Frequency Provider Last Rate Last Dose  . acetaminophen (TYLENOL) tablet 650 mg  650 mg Oral Q6H PRN Purohit, Salli Quarry, MD       Or  . acetaminophen (TYLENOL) suppository 650 mg  650 mg Rectal Q6H PRN Purohit, Shrey C, MD      . albuterol (PROVENTIL) (2.5 MG/3ML) 0.083% nebulizer solution 2.5 mg  2.5 mg Nebulization Q6H PRN Purohit, Shrey C, MD      . aztreonam (AZACTAM) 2 g in sodium chloride 0.9 % 100 mL IVPB  2 g Intravenous Q8H Penny Pia, MD 200 mL/hr at 02/21/17 0544 2 g at 02/21/17 0544  . folic acid (FOLVITE) tablet 1 mg  1 mg Oral Daily Purohit, Shrey C, MD   1 mg at 02/20/17 1026  . lactated ringers infusion   Intravenous Continuous Sampson Goon,  Chrissie NoaWilliam, MD 10 mL/hr at 02/18/17 1332    . lactulose (CHRONULAC) 10 GM/15ML solution 10 g  10 g Oral BID Glade LloydAlekh, Kshitiz, MD   10 g at 02/20/17 2230  . MEDLINE mouth rinse  15 mL Mouth Rinse BID Purohit, Shrey C, MD   15 mL at 02/19/17 2207  . morphine 2 MG/ML injection 0.5 mg  0.5 mg Intravenous Q2H PRN Ramond MarrowVarkey, Dax T, MD   0.5 mg at 02/21/17 0551  . multivitamin with minerals tablet 1 tablet  1 tablet Oral Daily Purohit, Salli QuarryShrey C, MD   1 tablet at 02/20/17 1026  . octreotide (SANDOSTATIN) 500 mcg in sodium chloride 0.9 % 250 mL (2 mcg/mL) infusion  50 mcg/hr Intravenous Continuous Purohit, Shrey C, MD 25 mL/hr at 02/21/17 0429 50 mcg/hr at 02/21/17 0429  . ondansetron (ZOFRAN) tablet 4 mg  4 mg Oral Q6H PRN Purohit,  Shrey C, MD       Or  . ondansetron (ZOFRAN) injection 4 mg  4 mg Intravenous Q6H PRN Purohit, Shrey C, MD   4 mg at 02/19/17 1101  . oxyCODONE (Oxy IR/ROXICODONE) immediate release tablet 5-10 mg  5-10 mg Oral Q4H PRN Bjorn PippinVarkey, Dax T, MD   10 mg at 02/21/17 0816  . pantoprazole (PROTONIX) injection 40 mg  40 mg Intravenous Q12H Brahmbhatt, Parag, MD   40 mg at 02/20/17 2229  . phenol (CHLORASEPTIC) mouth spray 1 spray  1 spray Mouth/Throat PRN Penny PiaVega, Orlando, MD   1 spray at 02/16/17 1529  . sodium chloride flush (NS) 0.9 % injection 10-40 mL  10-40 mL Intracatheter PRN Glade LloydAlekh, Kshitiz, MD   10 mL at 02/20/17 1524  . sodium chloride flush (NS) 0.9 % injection 3 mL  3 mL Intravenous Q12H Purohit, Shrey C, MD   3 mL at 02/20/17 2230  . thiamine (VITAMIN B-1) tablet 100 mg  100 mg Oral Daily Purohit, Shrey C, MD   100 mg at 02/20/17 1026   Or  . thiamine (B-1) injection 100 mg  100 mg Intravenous Daily Purohit, Shrey C, MD   100 mg at 02/18/17 1050     Discharge Medications: Please see discharge summary for a list of discharge medications.  Relevant Imaging Results:  Relevant Lab Results:   Additional Information SSN: [id number]  Noelani Harbach S Cedar CrestRayyan, [place]

## 2017-02-21 NOTE — Progress Notes (Signed)
PROGRESS NOTE  Debbie Barker ZOX:[id number] DOB: 08-Dec-1970 DOA: 02/15/2017 PCP: [name], [name], NP  Brief Narrative: 46yow PMH alcoholic cirrhosis, complicated history, presented with fall and leg pain as well as hematemesis. Admitted for acute GIB, alcohol abuse, AKI, right hip fx. Treated with Protonix and octreotide infusions. S/p EGD 2/15 showing recently bleeding varices. Varices banded. Seen by orthopedics and s/p femur nail 2/17.  Assessment/Plan Acute esophageal variceal bleed. Treated with Protonix and octreotide infusions. S/p EGD with treatment 2/15.  - Repeat EGD for further band ligation in 3-4 weeks. - BID Protonix 3 weeks then daily   ABLA. Secondary to above. Hgb 3.2 on admission. S/p 4 units PRBC since admission, last 2/16. No active bleeding noted but Hgb trending down. - check CBC in AM  Decompensated cirrhosis (alcohol, hepatitis C), esophageal varices, hepatic encephalopathy, thrombocytopenia, coagulopathy, hyponatremia. MELD 30. Not a candidate for prednisone secondary to GIB. - supportive care for now. Follow-up with GI as an outpatient.  Acute hepatic encephalopathy - lactulose - check ammonia in AM  Closed right intertrochanteric femur fracture. S/p surgery 2/17 - WBAT RLE. Ortho rec against anticoagulation  - f/u with Dr. Everardo PacificVarkey end of February  Alcohol abuse, ongoing - no evidence of withdrawal   AKI - resolving  - follow clinically  Right 10th rib fracture - no intervention suggestion  Hepatitis C - follow-up with GI as an outpatient  PMH chronic pancreatitis    Will check CBC in AM. Can likely go to SNF next 48 hours  DVT prophylaxis: SCDs Code Status: full Family Communication: brother and pt's boyfriend at bedside Disposition Plan: SNF    Brendia Sacksaniel Virgilene Stryker, MD  [facility] Direct contact: [phone] --Via amion app OR  --[email or web address]; password [email or web address]  7PM-7AM contact night coverage as above 02/21/2017, 5:55 PM  LOS: 6 days     Consultants:  GI  Orthopedics  CIR  Procedures:  EGD Impression:               - Recently bleeding large (> 5 mm) esophageal                            varices. Completely eradicated. Banded.                           - Portal hypertensive gastropathy.                           - Portal hypertensive gastropathy.                           - Normal duodenal bulb, first portion of the                            duodenum and second portion of the duodenum.                           - No specimens collected.   2/17 Short gamma nail r femur  2/18 PICC insertion 02/17/17 0806  Transfuse RBC Tranfuse 1 unit (1 of 1 released)    02/17/17 0806  02/16/17 1116  Transfuse RBC Tranfuse 1 unit (1 of 1 released)    02/16/17 1116  02/15/17 0730  Transfuse RBC Tranfuse 2 units (2 of 2 released)  Antimicrobials:  Azactam 02/15/2017 >>   Interval history/Subjective: Feels ok.  Objective: Vitals:  Vitals:   02/21/17 0441 02/21/17 1339  BP: (!) 92/55 (!) 104/50  Pulse: (!) 103 100  Resp: 18 18  Temp: 98.3 F (36.8 C) 98 F (36.7 C)  SpO2: 97% 100%    Exam:  Constitutional:  . Appears calm. Mildly uncomfortable. Chronically ill. Eyes:  . pupils and irises appear normal . Normal lids  ENMT:  . grossly normal hearing  . Lips appear normal Respiratory:  . CTA bilaterally, no w/r/r.  . Respiratory effort normal Cardiovascular:  . RRR, no m/r/g . 2+ BLE extremity edema   Abdomen:  . Soft, ntd Psychiatric:  . Mental status o Mood, affect odd o Confused, disoriented . judgement and insight impaired   I have personally reviewed the following:  Filed Weights   02/15/17 2106 02/16/17 0045 02/17/17 1858  Weight: 72.6 kg (160 lb) 62.3 kg (137 lb 5.6 oz) 69.8 kg (153 lb 14.1 oz)   Weight change:   UOP: multiple voids I/O since admission: inaccurate Last BM charted: 2/20 Foley:  Telemetry: SR Status: telemetry  Labs:  CBG stable  Creatinine  stable 1.38  Mg 1.5  AST and ALT trending down  T bili trending down  Hgb stable 7.0  Plts stable 94  INR 2.19   Imaging studies:  CT head and neck negative   Scheduled Meds: . feeding supplement (ENSURE ENLIVE)  237 mL Oral BID BM  . folic acid  1 mg Oral Daily  . lactulose  10 g Oral BID  . mouth rinse  15 mL Mouth Rinse BID  . multivitamin with minerals  1 tablet Oral Daily  . pantoprazole (PROTONIX) IV  40 mg Intravenous Q12H  . sodium chloride flush  3 mL Intravenous Q12H  . thiamine  100 mg Oral Daily   Or  . thiamine  100 mg Intravenous Daily   Continuous Infusions:   Principal Problem:   Acute upper GI bleeding Active Problems:   Alcoholic cirrhosis of liver with ascites (HCC)   Secondary esophageal varices with bleeding (HCC)   AKI (acute kidney injury) (HCC)   Alcohol abuse   Acute on chronic alcoholic liver disease (HCC)   Acute upper GI bleed   Thrombocytopenia (HCC)   Acute hepatitis C virus infection without hepatic coma   Acute blood loss anemia   Anemia of chronic disease   Benign essential HTN   Ascites due to alcoholic cirrhosis (HCC)   Pain   Acute GI bleeding   LOS: 6 days

## 2017-02-21 NOTE — Progress Notes (Signed)
Initial Nutrition Assessment  DOCUMENTATION CODES:   Not applicable  INTERVENTION:  Ensure Enlive po BID, each supplement provides 350 kcal and 20 grams of protein  NUTRITION DIAGNOSIS:   Inadequate oral intake related to lethargy/confusion as evidenced by per patient/family report, meal completion < 25%.  GOAL:   Patient will meet greater than or equal to 90% of their needs   MONITOR:   PO intake, Labs, I & O's, Weight trends, Supplement acceptance  REASON FOR ASSESSMENT:   Consult Assessment of nutrition requirement/status  ASSESSMENT:   Debbie Barker is a 47 y.o. female with medical history significant of alcoholic cirrhosis complicated by chronic pancreatitis, esophageal varices with prior GI bleeds, ascites, active alcohol abuse, depression who comes in with fall and severe leg pain in the setting of presyncopal episode after several days of hematemesis presents with acute on chronic liver failure, AKI, upper GI bleed, pancreatitis  Attempted to speak with patient but she doesn't provide much history. States PTA she was eating 2 meals a day. Unsure of weight loss. Brother says he believes she has lost weight but is unsure of how much she may have lost. Unclear if she ate much this morning. Meal completion 20% for breakfast, 10% for lunch.  Labs reviewed:  K+ 3.4, Mg 1.5, AST/ALT 74/94; Tbili 4.3 Medications reviewed and include:  Folic Acid, MVI w/ Minerals LR at 7910mL/hr Octreotide gtt   NUTRITION - FOCUSED PHYSICAL EXAM:    Most Recent Value  Orbital Region  No depletion  Upper Arm Region  Mild depletion  Thoracic and Lumbar Region  No depletion  Buccal Region  No depletion  Temple Region  No depletion  Clavicle Bone Region  Mild depletion  Clavicle and Acromion Bone Region  No depletion  Scapular Bone Region  No depletion  Dorsal Hand  No depletion  Patellar Region  No depletion  Anterior Thigh Region  No depletion  Posterior Calf Region  Mild depletion   Edema (RD Assessment)  None  Hair  Reviewed  Eyes  Reviewed  Mouth  Reviewed  Skin  Other (Comment) [appears slightly yellow]  Nails  Reviewed       Diet Order:  Diet regular Room service appropriate? Yes; Fluid consistency: Thin  EDUCATION NEEDS:   Not appropriate for education at this time  Skin:  Skin Assessment: Skin Integrity Issues: Skin Integrity Issues:: Incisions Incisions: to R thigh  Last BM:  02/21/2017  Height:   Ht Readings from Last 1 Encounters:  02/17/17 5\' 8"  (1.727 m)    Weight:   Wt Readings from Last 1 Encounters:  02/17/17 153 lb 14.1 oz (69.8 kg)    Ideal Body Weight:  63.63 kg  BMI:  Body mass index is 23.4 kg/m.  Estimated Nutritional Needs:   Kcal:  1740-2000 calories  Protein:  83-104 grams  Fluid:  1.2L  Dionne AnoWilliam M. Zyir Gassert, MS, RD LDN Inpatient Clinical Dietitian Pager [phone]

## 2017-02-21 NOTE — Progress Notes (Signed)
Rehab admissions - Noted PT now recommending SNF placement.  I tried to contact someone by phone yesterday, but no answer.  Patient is confused.  Agree that SNF is best option at this point.  Call me for questions.  [phone]

## 2017-02-22 LAB — CBC
HEMATOCRIT: 23.1 % — AB (ref 36.0–46.0)
HEMOGLOBIN: 7.2 g/dL — AB (ref 12.0–15.0)
MCH: 29.1 pg (ref 26.0–34.0)
MCHC: 31.2 g/dL (ref 30.0–36.0)
MCV: 93.5 fL (ref 78.0–100.0)
Platelets: 112 10*3/uL — ABNORMAL LOW (ref 150–400)
RBC: 2.47 MIL/uL — AB (ref 3.87–5.11)
RDW: 21.9 % — ABNORMAL HIGH (ref 11.5–15.5)
WBC: 21 10*3/uL — ABNORMAL HIGH (ref 4.0–10.5)

## 2017-02-22 LAB — AMMONIA: Ammonia: 72 umol/L — ABNORMAL HIGH (ref 9–35)

## 2017-02-22 MED ORDER — LACTULOSE 10 GM/15ML PO SOLN
20.0000 g | Freq: Three times a day (TID) | ORAL | Status: DC
  Administered 2017-02-22 (×2): 20 g via ORAL
  Filled 2017-02-22: qty 30

## 2017-02-22 MED ORDER — PANTOPRAZOLE SODIUM 40 MG PO TBEC
40.0000 mg | DELAYED_RELEASE_TABLET | Freq: Two times a day (BID) | ORAL | Status: DC
  Administered 2017-02-22 – 2017-02-26 (×8): 40 mg via ORAL
  Filled 2017-02-22 (×8): qty 1

## 2017-02-22 MED ORDER — LACTULOSE 10 GM/15ML PO SOLN
30.0000 g | Freq: Three times a day (TID) | ORAL | Status: DC
  Administered 2017-02-22 – 2017-02-25 (×9): 30 g via ORAL
  Filled 2017-02-22 (×10): qty 45

## 2017-02-22 NOTE — Progress Notes (Signed)
PROGRESS NOTE  Debbie Barker ZOX:[id number] DOB: [birth year] DOA: 02/15/2017 PCP: Lavinia Sharps, NP  Brief Narrative: 46yow PMH alcoholic cirrhosis, complicated history, presented with fall and leg pain as well as hematemesis. Admitted for acute GIB, alcohol abuse, AKI, right hip fx. Treated with Protonix and octreotide infusions. S/p EGD 2/15 showing recently bleeding varices. Varices banded. Seen by orthopedics and s/p femur nail 2/17.  Assessment/Plan Acute hepatic encephalopathy - minimal improvement, still very confused - increase lactulose - check ammonia in AM 2/22  Acute esophageal variceal bleed. Treated with Protonix and octreotide infusions. S/p EGD with treatment 2/15.  - no recurrent bleeding - Repeat EGD for further band ligation in 3-4 weeks. - BID Protonix 3 weeks then daily   ABLA. Secondary to above. Hgb 3.2 on admission. S/p 4 units PRBC since admission, last 2/16.   - no active bleeding. Hgb stable.  Decompensated cirrhosis (alcohol, hepatitis C), esophageal varices, hepatic encephalopathy, thrombocytopenia, coagulopathy, hyponatremia. MELD 30. Not a candidate for prednisone secondary to GIB. - continue supportive care. Follow-up with GI as an outpatient.  Closed right intertrochanteric femur fracture. S/p surgery 2/17 - WBAT RLE. Ortho rec against anticoagulation  - f/u with Dr. Everardo Pacific end of February  Alcohol abuse, ongoing - no evidence of withdrawal   AKI - resolving  - follow clinically  Right 10th rib fracture - no intervention suggestion  Hepatitis C - follow-up with GI as an outpatient  PMH chronic pancreatitis   DVT prophylaxis: SCDs Code Status: full Family Communication: pt's boyfriend at bedside 2/21 Disposition Plan: SNF    Brendia Sacks, MD  [facility] Direct contact: [phone] --Via amion app OR  --[email or web address]; password [email or web address]  7PM-7AM contact night coverage as above 02/22/2017, 5:39 PM  LOS: 7 days    Consultants:  GI  Orthopedics  CIR  Procedures:  EGD Impression:               - Recently bleeding large (> 5 mm) esophageal                            varices. Completely eradicated. Banded.                           - Portal hypertensive gastropathy.                           - Portal hypertensive gastropathy.                           - Normal duodenal bulb, first portion of the                            duodenum and second portion of the duodenum.                           - No specimens collected.   2/17 Short gamma nail r femur  2/18 PICC insertion 02/17/17 0806  Transfuse RBC Tranfuse 1 unit (1 of 1 released)    02/17/17 0806  02/16/17 1116  Transfuse RBC Tranfuse 1 unit (1 of 1 released)    02/16/17 1116  02/15/17 0730  Transfuse RBC Tranfuse 2 units (2 of 2 released)          Antimicrobials:  Azactam  02/15/2017 >>   Interval history/Subjective: Less confused per boyfriend  History from patient unreliable.  Objective: Vitals:  Vitals:   02/22/17 0900 02/22/17 1442  BP: 107/79 (!) 106/57  Pulse: (!) 110 (!) 105  Resp: 10   Temp: 98.2 F (36.8 C) 99.1 F (37.3 C)  SpO2: 97% 95%    Exam:  Constitutional:   . Appears anxious, mildly uncomfortable, confused Respiratory:  . CTA bilaterally, no w/r/r.  . Respiratory effort normal Cardiovascular:  . RRR, no m/r/g . No LLE extremity edema. 2+ RLE edema  Abdomen:  . Distended, mild generalized tenderness Psychiatric:  . Mental status o Mood, affect appropriate   I have personally reviewed the following:   Labs:  Hgb stable, 7.2  Plts up to 112  WBC up to 21  Imaging studies:  CT head and neck negative   Scheduled Meds: . feeding supplement (ENSURE ENLIVE)  237 mL Oral BID BM  . folic acid  1 mg Oral Daily  . lactulose  20 g Oral TID  . mouth rinse  15 mL Mouth Rinse BID  . multivitamin with minerals  1 tablet Oral Daily  . pantoprazole (PROTONIX) IV  40 mg Intravenous  Q12H  . sodium chloride flush  3 mL Intravenous Q12H  . thiamine  100 mg Oral Daily   Continuous Infusions:   Principal Problem:   Acute upper GI bleeding Active Problems:   Alcoholic cirrhosis of liver with ascites (HCC)   Secondary esophageal varices with bleeding (HCC)   AKI (acute kidney injury) (HCC)   Alcohol abuse   Acute on chronic alcoholic liver disease (HCC)   Acute upper GI bleed   Thrombocytopenia (HCC)   Acute hepatitis C virus infection without hepatic coma   Acute blood loss anemia   Anemia of chronic disease   Benign essential HTN   Ascites due to alcoholic cirrhosis (HCC)   Pain   Acute GI bleeding   LOS: 7 days

## 2017-02-22 NOTE — Progress Notes (Signed)
Occupational Therapy Treatment Patient Details Name: Debbie MawDena Waterbury MRN: [id number] DOB: [birth year] Today's Date: 02/22/2017    History of present illness Debbie Barker is a 47 y.o. female with medical history significant of alcoholic cirrhosis complicated by chronic pancreatitis, esophageal varices with prior GI bleeds, ascites, active alcohol abuse, depression who comes in with fall and severe leg pain in the setting of presyncopal episode after several days of hematemesis. Pt underwent IM nail R femur (though procedure was delayed due to pt's low hgb). Pt recently [facility].    OT comments  Pt very lethargic throughout session and grabbing at medical equipment and bandages once B mittens removed. Pt following commands with increased time. Pt reports feeling dizzy and nauseated once seated on EOB. Pt not following commands to return to supine and instead attempting to stand. Pt required mod A to stand on her own and once again began trying to pull on various items in standing. Pt requiring assistance to place R LE onto bed and pt returned to supine. Pt would continue to benefit from OT intervention.   Follow Up Recommendations  SNF    Equipment Recommendations  Other (comment)(defer to next venue of care)    Recommendations for Other Services      Precautions / Restrictions Precautions Precautions: Fall Precaution Comments: pt has had multiple falls at home. Brother present and says there is some question of pt's boyfriend's involvement in some of these falls.  Restrictions Weight Bearing Restrictions: Yes RLE Weight Bearing: Weight bearing as tolerated       Mobility Bed Mobility Overal bed mobility: Needs Assistance Bed Mobility: Supine to Sit     Supine to sit: Min assist        Transfers     Transfers: Sit to/from BJ'sStand;Stand Pivot Transfers Sit to Stand: Mod assist         General transfer comment: mod lifting assistance from bed level. Pt  standing for 2 minutes but grimacing in pain and then attempting to pull at IV and various other items.    Balance Overall balance assessment: Needs assistance Sitting-balance support: Bilateral upper extremity supported Sitting balance-Leahy Scale: Fair Sitting balance - Comments: able to maintain sitting EOB with close supervision   Standing balance support: Bilateral upper extremity supported Standing balance-Leahy Scale: Poor Standing balance comment: heavily reliant on UE support                            ADL either performed or assessed with clinical judgement     Cognition Arousal/Alertness: Lethargic;Suspect due to medications Behavior During Therapy: Flat affect Overall Cognitive Status: Impaired/Different from baseline Area of Impairment: Orientation;Memory;Following commands;Problem solving          Orientation Level: Disoriented to;Time;Situation   Memory: Decreased short-term memory Following Commands: Follows one step commands with increased time     Problem Solving: Slow processing;Decreased initiation;Requires verbal cues;Requires tactile cues;Difficulty sequencing                     Pertinent Vitals/ Pain       Pain Assessment: Faces Faces Pain Scale: Hurts whole lot Pain Location: R hip Pain Descriptors / Indicators: Aching;Grimacing;Discomfort Pain Intervention(s): Monitored during session;Repositioned;Premedicated before session;Limited activity within patient's tolerance         Frequency  Min 2X/week        Progress Toward Goals  OT Goals(current goals can now be found in  the care plan section)  Progress towards OT goals: Progressing toward goals  Acute Rehab OT Goals Patient Stated Goal: none stated OT Goal Formulation: With patient Time For Goal Achievement: 03/08/17 Potential to Achieve Goals: Good  Plan Discharge plan needs to be updated       AM-PAC PT "6 Clicks" Daily Activity     Outcome Measure   Help  from another person eating meals?: A Little Help from another person taking care of personal grooming?: A Little Help from another person toileting, which includes using toliet, bedpan, or urinal?: A Lot Help from another person bathing (including washing, rinsing, drying)?: A Lot Help from another person to put on and taking off regular upper body clothing?: A Little Help from another person to put on and taking off regular lower body clothing?: A Lot 6 Click Score: 15    End of Session Equipment Utilized During Treatment: Rolling walker  OT Visit Diagnosis: Unsteadiness on feet (R26.81);Other abnormalities of gait and mobility (R26.89)   Activity Tolerance Patient tolerated treatment well;Patient limited by lethargy   Patient Left in bed;with bed alarm set;with family/visitor present   Nurse Communication Mobility status        Time: 1336-1401 OT Time Calculation (min): 25 min  Charges: OT General Charges $OT Visit: 1 Visit OT Treatments $Therapeutic Activity Peds: 23-37 mins(25)   Kamaron Deskins P, MS, OTR/L 02/22/2017, 2:36 PM

## 2017-02-22 NOTE — Progress Notes (Signed)
Physical Therapy Treatment Patient Details Name: Debbie Barker MRN: [id number] DOB: [birth year] Today's Date: 02/22/2017    History of Present Illness Debbie Barker is a 47 y.o. female with medical history significant of alcoholic cirrhosis complicated by chronic pancreatitis, esophageal varices with prior GI bleeds, ascites, active alcohol abuse, depression who comes in with fall and severe leg pain in the setting of presyncopal episode after several days of hematemesis. Pt underwent IM nail R femur (though procedure was delayed due to pt's low hgb). Pt recently d[facility].     PT Comments    Pt with slow progress due to cognitive status.   Follow Up Recommendations  SNF     Equipment Recommendations  Rolling walker with 5" wheels    Recommendations for Other Services       Precautions / Restrictions Precautions Precautions: Fall Precaution Comments: pt has had multiple falls at home. Brother present and says there is some question of pt's boyfriend's involvement in some of these falls.  Restrictions Weight Bearing Restrictions: Yes RLE Weight Bearing: Weight bearing as tolerated    Mobility  Bed Mobility Overal bed mobility: Needs Assistance Bed Mobility: Supine to Sit     Supine to sit: Mod assist     General bed mobility comments: Assist to move legs off bed, elevate trunk into sitting, and bring hips to EOB  Transfers Overall transfer level: Needs assistance Equipment used: Ambulation equipment used Transfers: Sit to/from [employer] CorporationStand;Stand Pivot Transfers Sit to Stand: Mod assist;+2 safety/equipment Stand pivot transfers: (with Stedy)       General transfer comment: Assist to bring hips up. Incr time to come to stand. Pt with flexed trunk and assist and time to stand more erect  Ambulation/Gait                 Stairs            Wheelchair Mobility    Modified Rankin (Stroke Patients Only)       Balance Overall balance  assessment: Needs assistance Sitting-balance support: No upper extremity supported;Feet supported Sitting balance-Leahy Scale: Fair Sitting balance - Comments: able to maintain sitting EOB with close supervision   Standing balance support: Bilateral upper extremity supported Standing balance-Leahy Scale: Poor Standing balance comment: Stedy and min assist for static standing                            Cognition Arousal/Alertness: Awake/alert Behavior During Therapy: Flat affect Overall Cognitive Status: Impaired/Different from baseline Area of Impairment: Orientation;Memory;Following commands;Problem solving                 Orientation Level: Disoriented to;Time;Situation;Place   Memory: Decreased recall of precautions;Decreased short-term memory Following Commands: Follows one step commands with increased time     Problem Solving: Slow processing;Decreased initiation;Requires verbal cues;Requires tactile cues;Difficulty sequencing        Exercises      General Comments        Pertinent Vitals/Pain Pain Assessment: Faces Faces Pain Scale: Hurts even more Pain Location: R hip Pain Descriptors / Indicators: Grimacing;Discomfort;Guarding Pain Intervention(s): Limited activity within patient's tolerance;Monitored during session;Repositioned    Home Living                      Prior Function            PT Goals (current goals can now be found in the care plan section)  Acute Rehab PT Goals Patient Stated Goal: none stated Progress towards PT goals: Progressing toward goals    Frequency    Min 3X/week      PT Plan Current plan remains appropriate    Co-evaluation              AM-PAC PT "6 Clicks" Daily Activity  Outcome Measure  Difficulty turning over in bed (including adjusting bedclothes, sheets and blankets)?: Unable Difficulty moving from lying on back to sitting on the side of the bed? : Unable Difficulty sitting down  on and standing up from a chair with arms (e.g., wheelchair, bedside commode, etc,.)?: Unable Help needed moving to and from a bed to chair (including a wheelchair)?: Total Help needed walking in hospital room?: Total Help needed climbing 3-5 steps with a railing? : Total 6 Click Score: 6    End of Session Equipment Utilized During Treatment: Gait belt Activity Tolerance: Patient limited by fatigue;Other (comment)(cognition) Patient left: in chair;with call bell/phone within reach;with family/visitor present;with chair alarm set Nurse Communication: Mobility status;Need for lift equipment PT Visit Diagnosis: Unsteadiness on feet (R26.81);Muscle weakness (generalized) (M62.81);Repeated falls (R29.6);Pain;Difficulty in walking, not elsewhere classified (R26.2) Pain - Right/Left: Right Pain - part of body: Hip     Time: 1510-1531 PT Time Calculation (min) (ACUTE ONLY): 21 min  Charges:  $Therapeutic Activity: 23-37 mins                    G Codes:       [facility] PT [phone]    Angelina Ok [facility] -  02/22/2017, 5:04 PM

## 2017-02-22 NOTE — Progress Notes (Signed)
Patient currently has no bed offers. CSW continuing LOG bed search.   Osborne Cascoadia Trease [place] LCSW [phone]

## 2017-02-22 NOTE — Progress Notes (Signed)
ORTHOPAEDIC PROGRESS NOTE  s/p Procedure(s): INTRAMEDULLARY (IM) NAIL INTERTROCHANTRIC  SUBJECTIVE: Complains of pain  OBJECTIVE: PE: WGN:[id number]:incisions with some scattered brusing, otherwise intact, leg lengths equal, warm well perfused foot, intact EHL/TA/GSC   Vitals:   02/21/17 2143 02/22/17 0428  BP: (!) 122/59 (!) 100/57  Pulse: (!) 111 (!) 106  Resp: 18 18  Temp: 98 F (36.7 C) 97.9 F (36.6 C)  SpO2: 98% 94%     ASSESSMENT: Debbie Barker is a [age] y.o. female doing well postoperatively.  PLAN: Weightbearing: WBAT RLE Insicional and dressing care: Dressing will likely continue to occasionally bleed through due to coagulopathy.  Please reinforce as needed.  After 3-4 days can likely take down and leave open. Orthopedic device(s): None Showering: not until dressing change VTE prophylaxis: Not indicated per ortho as patient INR around 2 at baseline.  Defer to medicine  Pain control: PRN meds.  Patient seems to be either sedated or asking for pain medications.  Limited options on non narcotics as she has liver issues as well as a gi bleed.  Recommend just continuing oxycodone and icing area. Follow - up plan: 2 weeks Contact information:  Weekdays 8-5 Ramond Marrowax Zachrey Deutscher MD [phone], After hours and holidays please check [email or web address] for group call information for [facility]

## 2017-02-22 NOTE — Progress Notes (Signed)
Physical Therapy Treatment Patient Details Name: Debbie Barker MRN: [id number] DOB: 22-Mar-1970 Today's Date: 02/22/2017    History of Present Illness Debbie Barker is a 47 y.o. female with medical history significant of alcoholic cirrhosis complicated by chronic pancreatitis, esophageal varices with prior GI bleeds, ascites, active alcohol abuse, depression who comes in with fall and severe leg pain in the setting of presyncopal episode after several days of hematemesis. Pt underwent IM nail R femur (though procedure was delayed due to pt's low hgb). [facility].     PT Comments    Pt up in chair x 30 minutes but restless and constantly trying to move out of chair. Returned to bed with Stedy. Pt able to achieve more erect posture than earlier.    Follow Up Recommendations  SNF     Equipment Recommendations  Rolling walker with 5" wheels    Recommendations for Other Services       Precautions / Restrictions Precautions Precautions: Fall Precaution Comments: pt has had multiple falls at home. Brother present and says there is some question of pt's boyfriend's involvement in some of these falls.  Restrictions Weight Bearing Restrictions: Yes RLE Weight Bearing: Weight bearing as tolerated    Mobility  Bed Mobility Overal bed mobility: Needs Assistance Bed Mobility: Sit to Supine      Sit to supine: Min assist   General bed mobility comments: Assist to bring legs up into bed  Transfers Overall transfer level: Needs assistance Equipment used: Ambulation equipment used Transfers: Sit to/from UGI CorporationStand;Stand Pivot Transfers Sit to Stand: Min assist Stand pivot transfers: (with Stedy)       General transfer comment: Assist to bring hips up. Able to achieve  full erect posture  Ambulation/Gait                 Stairs            Wheelchair Mobility    Modified Rankin (Stroke Patients Only)       Balance Overall balance  assessment: Needs assistance Sitting-balance support: No upper extremity supported;Feet supported Sitting balance-Leahy Scale: Fair Sitting balance - Comments: able to maintain sitting EOB with close supervision   Standing balance support: Bilateral upper extremity supported Standing balance-Leahy Scale: Poor Standing balance comment: Stood x 2 on Stedy for 1 minute with min asssist.                            Cognition Arousal/Alertness: Awake/alert Behavior During Therapy: Flat affect Overall Cognitive Status: Impaired/Different from baseline Area of Impairment: Orientation;Memory;Following commands;Problem solving                 Orientation Level: Disoriented to;Time;Situation;Place   Memory: Decreased recall of precautions;Decreased short-term memory Following Commands: Follows one step commands with increased time     Problem Solving: Slow processing;Decreased initiation;Requires verbal cues;Requires tactile cues;Difficulty sequencing        Exercises      General Comments        Pertinent Vitals/Pain Pain Assessment: Faces Faces Pain Scale: Hurts even more Pain Location: R hip Pain Descriptors / Indicators: Grimacing;Discomfort;Guarding Pain Intervention(s): Limited activity within patient's tolerance;Monitored during session;Repositioned    Home Living                      Prior Function            PT Goals (current goals can now  be found in the care plan section) Acute Rehab PT Goals Patient Stated Goal: none stated Progress towards PT goals: Progressing toward goals    Frequency    Min 3X/week      PT Plan Current plan remains appropriate    Co-evaluation              AM-PAC PT "6 Clicks" Daily Activity  Outcome Measure  Difficulty turning over in bed (including adjusting bedclothes, sheets and blankets)?: Unable Difficulty moving from lying on back to sitting on the side of the bed? : Unable Difficulty  sitting down on and standing up from a chair with arms (e.g., wheelchair, bedside commode, etc,.)?: Unable Help needed moving to and from a bed to chair (including a wheelchair)?: Total Help needed walking in hospital room?: Total Help needed climbing 3-5 steps with a railing? : Total 6 Click Score: 6    End of Session Equipment Utilized During Treatment: Gait belt Activity Tolerance: Patient limited by fatigue;Other (comment)(cognition) Patient left: with family/visitor present;in bed;with call bell/phone within reach;with bed alarm set Nurse Communication: Mobility status;Need for lift equipment PT Visit Diagnosis: Unsteadiness on feet (R26.81);Muscle weakness (generalized) (M62.81);Repeated falls (R29.6);Pain;Difficulty in walking, not elsewhere classified (R26.2) Pain - Right/Left: Right Pain - part of body: Hip     Time: 9604-5409 PT Time Calculation (min) (ACUTE ONLY): 17 min  Charges:  $Therapeutic Activity: 8-22 mins                    G Codes:       [facility] PT [phone]    Angelina Ok [facility] 02/22/2017, 5:22 PM

## 2017-02-22 NOTE — Care Management Note (Signed)
Case Management Note  Patient Details  Name: Debbie MawDena Barker MRN: [id number] Date of Birth: 1970/08/28  Subjective/Objective:    Admitted with GI bleed, ETOH abuse, AKI, R hip fx..    - s/p EGD 2/15  bleeding varices/Varicesbanded, s/p femur nail 2/1 Resides with boyfriend PTA.  PCP: Mee HivesMary Placey  Action/Plan: Transition to SNF when medically stable. CSW managing disposition to facility.CM will continue to monitor as needs presents.  Expected Discharge Date:                 Expected Discharge Plan:  Skilled Nursing Facility  In-House Referral:  Clinical Social Work  Discharge planning Services  CM Consult  Post Acute Care Choice:    Choice offered to:     DME Arranged:    DME Agency:     HH Arranged:    HH Agency:     Status of Service:  Completed, signed off  If discussed at [employer] of [employer] Meetings, dates discussed:    Additional Comments:  Epifanio LeschesCole, Cheree Fowles Hudson, RN 02/22/2017, 7:43 PM

## 2017-02-23 LAB — COMPREHENSIVE METABOLIC PANEL
ALT: 77 U/L — AB (ref 14–54)
AST: 84 U/L — ABNORMAL HIGH (ref 15–41)
Albumin: 1.6 g/dL — ABNORMAL LOW (ref 3.5–5.0)
Alkaline Phosphatase: 132 U/L — ABNORMAL HIGH (ref 38–126)
Anion gap: 8 (ref 5–15)
BILIRUBIN TOTAL: 5.9 mg/dL — AB (ref 0.3–1.2)
BUN: 19 mg/dL (ref 6–20)
CHLORIDE: 107 mmol/L (ref 101–111)
CO2: 20 mmol/L — ABNORMAL LOW (ref 22–32)
CREATININE: 1.38 mg/dL — AB (ref 0.44–1.00)
Calcium: 8.1 mg/dL — ABNORMAL LOW (ref 8.9–10.3)
GFR calc Af Amer: 52 mL/min — ABNORMAL LOW (ref >60)
GFR, EST NON AFRICAN AMERICAN: 45 mL/min — AB (ref >60)
Glucose, Bld: 128 mg/dL — ABNORMAL HIGH (ref 65–99)
Potassium: 3.3 mmol/L — ABNORMAL LOW (ref 3.5–5.1)
Sodium: 135 mmol/L (ref 135–145)
TOTAL PROTEIN: 6 g/dL — AB (ref 6.5–8.1)

## 2017-02-23 LAB — AMMONIA: AMMONIA: 81 umol/L — AB (ref 9–35)

## 2017-02-23 MED ORDER — POTASSIUM CHLORIDE 20 MEQ PO PACK
40.0000 meq | PACK | Freq: Once | ORAL | Status: AC
  Administered 2017-02-23: 40 meq via ORAL
  Filled 2017-02-23: qty 2

## 2017-02-23 MED ORDER — ALBUMIN HUMAN 25 % IV SOLN: 25.0000 g | Freq: Once | INTRAVENOUS | Status: DC

## 2017-02-23 MED ORDER — LORAZEPAM 0.5 MG PO TABS
0.5000 mg | ORAL_TABLET | Freq: Four times a day (QID) | ORAL | Status: DC | PRN
  Administered 2017-02-23 – 2017-02-24 (×3): 0.5 mg via ORAL
  Filled 2017-02-23 (×4): qty 1

## 2017-02-23 MED ORDER — SODIUM CHLORIDE 0.9 % IV SOLN
2.0000 g | Freq: Three times a day (TID) | INTRAVENOUS | Status: DC
  Administered 2017-02-23 – 2017-02-26 (×9): 2 g via INTRAVENOUS
  Filled 2017-02-23 (×10): qty 2

## 2017-02-23 MED ORDER — ALBUMIN HUMAN 25 % IV SOLN: 12.5000 g | Freq: Once | INTRAVENOUS | Status: DC

## 2017-02-23 MED ORDER — RIFAXIMIN 550 MG PO TABS
550.0000 mg | ORAL_TABLET | Freq: Two times a day (BID) | ORAL | Status: DC
  Administered 2017-02-23 – 2017-02-26 (×7): 550 mg via ORAL
  Filled 2017-02-23 (×7): qty 1

## 2017-02-23 MED ORDER — NYSTATIN 100000 UNIT/ML MT SUSP
5.0000 mL | Freq: Four times a day (QID) | OROMUCOSAL | Status: DC
  Administered 2017-02-23 – 2017-02-26 (×12): 500000 [IU] via ORAL
  Filled 2017-02-23 (×11): qty 5

## 2017-02-23 NOTE — Progress Notes (Signed)
PROGRESS NOTE  Debbie Barker ZOX:[id number] DOB: 30-Nov-1970 DOA: 02/15/2017 PCP: Lavinia Sharps, NP  Brief Narrative: 46yow PMH alcoholic cirrhosis, complicated history, presented with fall and leg pain as well as hematemesis. Admitted for acute GIB, alcohol abuse, AKI, right hip fx. Treated with Protonix and octreotide infusions. S/p EGD 2/15 showing recently bleeding varices. Varices banded. Seen by orthopedics and s/p femur nail 2/17.  Assessment/Plan Acute hepatic encephalopathy - no change clinically. Ammonia higher.  - lactulose increased last PM. Rifaxin added today  Decompensated cirrhosis (alcohol, hepatitis C), esophageal varices, hepatic encephalopathy, thrombocytopenia, coagulopathy, hyponatremia. MELD 30. Not a candidate for prednisone secondary to GIB. - appears to have increased ascities, plan as below  Diffuse abdominal pain, suspect significant ascites - will ask for LV paracentesis, diag and therapuetic with labs - will check CBC in AM and start IV abx empiric tx for possible SBP pending results of paracentesis - contact is brother Hermelinda Dellen [phone]    Acute esophageal variceal bleed. Treated with Protonix and octreotide infusions. S/p EGD with treatment 2/15.  - no recurrent bleeding - Repeat EGD for further band ligation in 3-4 weeks. - BID Protonix 3 weeks then daily   ABLA. Secondary to above. Hgb 3.2 on admission. S/p 4 units PRBC since admission, last 2/16.   - no active bleeding. Hgb stable.  Closed right intertrochanteric femur fracture. S/p surgery 2/17 - WBAT RLE. Ortho rec against anticoagulation  - f/u with Dr. Everardo Pacific end of February  Alcohol abuse, ongoing - no evidence of withdrawal   AKI, suspect CKD stage III - likely at baseline - follow clinically  Right 10th rib fracture - no intervention suggestion  Hepatitis C - follow-up with GI as an outpatient  PMH chronic pancreatitis   Prognosis remains guarded. Concerned for ESLD.  Consult PMT.   DVT prophylaxis: SCDs Code Status: full Family Communication: pt's boyfriend at bedside 2/22 Disposition Plan: SNF    Brendia Sacks, MD  [facility] Direct contact: [phone] --Via amion app OR  --[email or web address]; password [email or web address]  7PM-7AM contact night coverage as above 02/23/2017, 10:12 AM  LOS: 8 days   Consultants:  GI  Orthopedics  CIR  Procedures:  EGD Impression:               - Recently bleeding large (> 5 mm) esophageal                            varices. Completely eradicated. Banded.                           - Portal hypertensive gastropathy.                           - Portal hypertensive gastropathy.                           - Normal duodenal bulb, first portion of the                            duodenum and second portion of the duodenum.                           - No specimens collected.   2/17 Short gamma nail r femur  2/18 PICC insertion  02/17/17 0806  Transfuse RBC Tranfuse 1 unit (1 of 1 released)    02/17/17 0806  02/16/17 1116  Transfuse RBC Tranfuse 1 unit (1 of 1 released)    02/16/17 1116  02/15/17 0730  Transfuse RBC Tranfuse 2 units (2 of 2 released)          Antimicrobials:  Azactam 02/15/2017 >> 2/20  Interval history/Subjective: Still confused. Bowels moving, poor appetite. C/o abd pain.  Objective: Vitals:  Vitals:   02/22/17 2034 02/23/17 0622  BP: (!) 113/99 99/64  Pulse: (!) 106 85  Resp: 18 18  Temp: 98.5 F (36.9 C) 98.2 F (36.8 C)  SpO2: 100% 93%    Exam:  Constitutional:   . Appears uncomfortable, confused, non-toxic but ill Eyes:  . pupils and irises appear normal. Sclerae icteric . Normal lids ENMT:  . grossly normal hearing  . Lips appear normal . Oropharynx: mucosa dry, tongue erythematous,posterior pharynx with thick white exudate Respiratory:  . CTA bilaterally, no w/r/r.  . Respiratory effort normal Cardiovascular:  . RRR, no m/r/g . No LLE extremity edema. No  change 2+ RLE edema Abdomen:  . Distended, diffusely tender, soft Skin:  . No rashes, lesions, ulcers Psychiatric:  . Mental status o Mood not assessable, affect odd o Oriented to self only . judgement and insight impaired     I have personally reviewed the following:   Labs:  Creatinine stable 1.38, K+ 3.3  T bili 4.3 > 5.9  Ammonia 72 > 81  Imaging studies:     Scheduled Meds: . feeding supplement (ENSURE ENLIVE)  237 mL Oral BID BM  . folic acid  1 mg Oral Daily  . lactulose  30 g Oral TID  . mouth rinse  15 mL Mouth Rinse BID  . multivitamin with minerals  1 tablet Oral Daily  . pantoprazole  40 mg Oral BID  . potassium chloride  40 mEq Oral Once  . rifaximin  550 mg Oral BID  . sodium chloride flush  3 mL Intravenous Q12H  . thiamine  100 mg Oral Daily   Continuous Infusions:   Principal Problem:   Acute upper GI bleeding Active Problems:   Alcoholic cirrhosis of liver with ascites (HCC)   Secondary esophageal varices with bleeding (HCC)   AKI (acute kidney injury) (HCC)   Alcohol abuse   Acute on chronic alcoholic liver disease (HCC)   Acute upper GI bleed   Thrombocytopenia (HCC)   Acute hepatitis C virus infection without hepatic coma   Acute blood loss anemia   Anemia of chronic disease   Benign essential HTN   Ascites due to alcoholic cirrhosis (HCC)   Pain   Acute GI bleeding   LOS: 8 days

## 2017-02-23 NOTE — Progress Notes (Signed)
Pharmacy Antibiotic Note  Debbie Barker is a 47 y.o. female admitted on 02/15/2017 with acute GIB, altered mental status secondary to cirrhosis/encephalopathy, and fall s/p right hip fx.  Pt was previously on Aztreonam for SBP.  Pt completed 7d course but acutely worsened overnight after abx stopped.  Also noted increased abd pain and worsening MS.  Pt reports PCN allergy, could not clarify PCN or ceph hx with patient due to AMS.  No cephs given in Epic system.  Pharmacy has been consulted to restart Aztreonam dosing.  Plan: Aztreonam 2gm IV q8h  Height: 5\' 8"  (172.7 cm) Weight: 153 lb 14.1 oz (69.8 kg) IBW/kg (Calculated) : 63.9  Temp (24hrs), Avg:98.5 F (36.9 C), Min:98 F (36.7 C), Max:99.1 F (37.3 C)  Recent Labs  Lab 02/18/17 1031 02/19/17 0124 02/20/17 0445 02/21/17 0444 02/22/17 0500 02/23/17 0446  WBC 8.2 9.5 12.5* 13.0* 21.0*  --   CREATININE 1.25* 1.44* 1.44* 1.38*  --  1.38*    Estimated Creatinine Clearance: 50.8 mL/min (A) (by C-G formula based on SCr of 1.38 mg/dL (H)).    Allergies  Allergen Reactions  . Penicillins     UNSPECIFIED REACTION   Has patient had a PCN reaction causing immediate rash, facial/tongue/throat swelling, SOB or lightheadedness with hypotension: Unknown Has patient had a PCN reaction causing severe rash involving mucus membranes or skin necrosis: Unknown Has patient had a PCN reaction that required hospitalization: Unknown Has patient had a PCN reaction occurring within the last 10 years: No If all of the above answers are "NO", then may proceed with Cephalosporin use.     Antimicrobials this admission: Aztreonam 2/14 >> 2/20, restart 2/22 >> Clinda 2/17 >> 2/18  Dose adjustments this admission:  Microbiology results: 2/16 MRSA PCR: negative  Thank you for allowing pharmacy to be a part of this patient's care.  Toys 'R' UsKimberly Lenda Baratta, Pharm.D., BCPS Clinical Pharmacist Pager: [phone] Clinical phone for 02/23/2017 from  8:30-4:00 is [phone]. After 4pm, please call Main Rx [phone]) for assistance. 02/23/2017 10:56 AM

## 2017-02-23 NOTE — Progress Notes (Signed)
Patient is confused and unable to consent herself.  I have tried to contact her emergency contact, Carolynn SayersLeslie Roberts (unknown relation), but she/he does not answer the phone.  Her paracentesis is on hold until a family member can give consent for the patient.  Letha CapeKelly E Chanequa Spees 12:32 PM 02/23/2017

## 2017-02-23 NOTE — Progress Notes (Signed)
Patient received oxycodone 10mg  x 3 this shift.

## 2017-02-23 NOTE — Progress Notes (Signed)
Tried to call Debbie Barker, brother, for consent for paracentesis.  Phone call went to voicemail.  Unable to proceed with para today.  Will retry for consent tomorrow and plan for para tomorrow as able.  Debbie Barker

## 2017-02-24 ENCOUNTER — Inpatient Hospital Stay (HOSPITAL_COMMUNITY): Payer: Medicaid Other

## 2017-02-24 DIAGNOSIS — B370 Candidal stomatitis: Secondary | ICD-10-CM

## 2017-02-24 LAB — COMPREHENSIVE METABOLIC PANEL
ALBUMIN: 1.6 g/dL — AB (ref 3.5–5.0)
ALT: 68 U/L — ABNORMAL HIGH (ref 14–54)
AST: 80 U/L — AB (ref 15–41)
Alkaline Phosphatase: 174 U/L — ABNORMAL HIGH (ref 38–126)
Anion gap: 11 (ref 5–15)
BUN: 23 mg/dL — AB (ref 6–20)
CHLORIDE: 108 mmol/L (ref 101–111)
CO2: 18 mmol/L — ABNORMAL LOW (ref 22–32)
Calcium: 8.3 mg/dL — ABNORMAL LOW (ref 8.9–10.3)
Creatinine, Ser: 1.27 mg/dL — ABNORMAL HIGH (ref 0.44–1.00)
GFR calc Af Amer: 57 mL/min — ABNORMAL LOW (ref >60)
GFR calc non Af Amer: 49 mL/min — ABNORMAL LOW (ref >60)
GLUCOSE: 97 mg/dL (ref 65–99)
POTASSIUM: 3.4 mmol/L — AB (ref 3.5–5.1)
Sodium: 137 mmol/L (ref 135–145)
Total Bilirubin: 5.2 mg/dL — ABNORMAL HIGH (ref 0.3–1.2)
Total Protein: 6 g/dL — ABNORMAL LOW (ref 6.5–8.1)

## 2017-02-24 LAB — CBC
HCT: 23.1 % — ABNORMAL LOW (ref 36.0–46.0)
HEMOGLOBIN: 7.2 g/dL — AB (ref 12.0–15.0)
MCH: 29 pg (ref 26.0–34.0)
MCHC: 31.2 g/dL (ref 30.0–36.0)
MCV: 93.1 fL (ref 78.0–100.0)
Platelets: 124 10*3/uL — ABNORMAL LOW (ref 150–400)
RBC: 2.48 MIL/uL — AB (ref 3.87–5.11)
RDW: 21 % — ABNORMAL HIGH (ref 11.5–15.5)
WBC: 25.3 10*3/uL — ABNORMAL HIGH (ref 4.0–10.5)

## 2017-02-24 LAB — PROTIME-INR
INR: 1.9
Prothrombin Time: 21.6 seconds — ABNORMAL HIGH (ref 11.4–15.2)

## 2017-02-24 LAB — AMMONIA: AMMONIA: 56 umol/L — AB (ref 9–35)

## 2017-02-24 MED ORDER — CHLORHEXIDINE GLUCONATE CLOTH 2 % EX PADS
6.0000 | MEDICATED_PAD | Freq: Two times a day (BID) | CUTANEOUS | Status: DC
  Administered 2017-02-24 – 2017-02-26 (×5): 6 via TOPICAL

## 2017-02-24 NOTE — Progress Notes (Signed)
Spoke with Dr. Irene LimboGoodrich about patient not eating, family dynamics, and drainage from right hip incision.

## 2017-02-24 NOTE — Progress Notes (Addendum)
PROGRESS NOTE  Debbie Barker ZOX:[id number] DOB: [birth year] DOA: 02/15/2017 PCP: Debbie Barker, [name], NP  Brief Narrative: 46yow PMH alcoholic cirrhosis, complicated history, presented with fall and leg pain as well as hematemesis. Admitted for acute GIB, alcohol abuse, AKI, right hip fx. Treated with Protonix and octreotide infusions. S/p EGD 2/15 showing recently bleeding varices. Varices banded. Seen by orthopedics and s/p femur nail 2/17.  Assessment/Plan Acute hepatic encephalopathy - no change clinically. Ammonia higher.  - lactulose increased last PM. Rifaxin added today  Decompensated cirrhosis (alcohol, hepatitis C), esophageal varices, hepatic encephalopathy, thrombocytopenia, coagulopathy, hyponatremia. MELD 23.  14-15% 5468-month mortality.  Not a candidate for prednisone secondary to GIB. -Ascites increasing. -Diagnostic and therapeutic paracentesis plan today.  Albumin ordered.  Suspected SBP, diffuse abdominal pain  -Paracentesis today as above.  Continue empiric antibiotic aztreonam given penicillin allergy.  Acute esophageal variceal bleed. Treated with Protonix and octreotide infusions. S/p EGD with treatment 2/15.  -Hemoglobin remained stable.  No evidence of recurrent bleeding. - Repeat EGD for further band ligation in 3-4 weeks. - BID Protonix 3 weeks then daily   ABLA. Secondary to above. Hgb 3.2 on admission. S/p 4 units PRBC since admission, last 2/16.   -Hemoglobin remained stable.  Closed right intertrochanteric femur fracture. S/p surgery 2/17 - WBAT RLE. Ortho rec against anticoagulation  - f/u with Dr. Everardo PacificVarkey end of February oral candidiasis  Oral candidiasis -Continue nystatin  Alcohol abuse, ongoing - no evidence of withdrawal   AKI, suspect CKD stage III -Suspect.  Renal function is now at baseline.  Right 10th rib fracture - no intervention suggestion  Hepatitis C - follow-up with GI as an outpatient  PMH chronic pancreatitis   Prognosis  remains guarded, encephalopathy and bit better today.  Oral intake is poor.  Palliative medicine consult.  DVT prophylaxis: SCDs Code Status: full Family Communication: pt's boyfriend at bedside 2/22 Disposition Plan: SNF    [name], MD  [facility] Direct contact: [phone] --Via amion app OR  --[email or web address]; password [email or web address]  7PM-7AM contact night coverage as above 02/24/2017, 12:33 PM  LOS: 9 days   Consultants:  GI  Orthopedics  CIR  Procedures:  EGD Impression:               - Recently bleeding large (> 5 mm) esophageal                            varices. Completely eradicated. Banded.                           - Portal hypertensive gastropathy.                           - Portal hypertensive gastropathy.                           - Normal duodenal bulb, first portion of the                            duodenum and second portion of the duodenum.                           - No specimens collected.   2/17 Short gamma nail r femur  2/18 PICC insertion 02/17/17 0806  Transfuse RBC Tranfuse 1 unit (1 of 1 released)    02/17/17 0806  02/16/17 1116  Transfuse RBC Tranfuse 1 unit (1 of 1 released)    02/16/17 1116  02/15/17 0730  Transfuse RBC Tranfuse 2 units (2 of 2 released)          Antimicrobials:  Azactam 02/15/2017 >> 2/20   Aztreonam 2/22 >>  Interval history/Subjective: Complains of abdominal pain.  Poor appetite.  Poor oral intake per brother at bedside.  Objective: Vitals:  Vitals:   02/23/17 2051 02/24/17 0541  BP: (!) 128/59 111/67  Pulse: (!) 104 (!) 111  Resp: 18 17  Temp: 98.4 F (36.9 C) 97.8 F (36.6 C)  SpO2: 94% 97%    Exam:  Constitutional:   . Appears ill, irritable, uncomfortable, nontoxic Respiratory:  . CTA bilaterally, no w/r/r.  . Respiratory effort normal Cardiovascular:  . RRR, no m/r/g . 2+ RLE extremity edema   Abdomen:  . Soft distended, mild generalized tenderness. Psychiatric:  . Mental  status o Mood difficult to assess, affect flat o Oriented to self only . judgement and insight appear impaired  I have personally reviewed the following:   Labs:  Potassium 3.4, creatinine stable, 1.27, albumin 1.6, transaminases stable.  Total bilirubin stable, 5.2.  Hemoglobin stable, 7.2.  WBC increased, 25.3.  Platelets stable, 1.24.  INR 1.9  Ammonia 72 > 81 > 56  Scheduled Meds: . Chlorhexidine Gluconate Cloth  6 each Topical Q12H  . feeding supplement (ENSURE ENLIVE)  237 mL Oral BID BM  . folic acid  1 mg Oral Daily  . lactulose  30 g Oral TID  . mouth rinse  15 mL Mouth Rinse BID  . multivitamin with minerals  1 tablet Oral Daily  . nystatin  5 mL Oral QID  . pantoprazole  40 mg Oral BID  . rifaximin  550 mg Oral BID  . sodium chloride flush  3 mL Intravenous Q12H  . thiamine  100 mg Oral Daily   Continuous Infusions: . albumin human    . aztreonam Stopped (02/24/17 0604)    Principal Problem:   Acute upper GI bleeding Active Problems:   Alcoholic cirrhosis of liver with ascites (HCC)   Secondary esophageal varices with bleeding (HCC)   AKI (acute kidney injury) (HCC)   Alcohol abuse   Acute on chronic alcoholic liver disease (HCC)   Acute upper GI bleed   Thrombocytopenia (HCC)   Acute hepatitis C virus infection without hepatic coma   Acute blood loss anemia   Anemia of chronic disease   Benign essential HTN   Ascites due to alcoholic cirrhosis (HCC)   Pain   Acute GI bleeding   LOS: 9 days    Plan greater than 35 minutes, >50% in counseling coordination of care, discussion with brother at bedside, discussion with Dr. Bonnielee Haff.

## 2017-02-24 NOTE — Progress Notes (Signed)
Palliative Medicine consult noted. Due to high referral volume, there may be a delay seeing this patient. Please call the Palliative Medicine Team office at [phone] if recommendations are needed in the interim.  Thank you for inviting us to see this patient.  Margret ChanceMelanie G. Domenique Southers, RN, BSN, [facility] Palliative Medicine Team 02/24/2017 8:00 AM Office [phone]

## 2017-02-24 NOTE — Progress Notes (Signed)
Provider notified nurse that consent was not obtainable yesterday for paracentesis, interventional radiology only open weekends for emergent cases, provider made aware, next of kin made aware that we will need this via signature or verbal on Monday.

## 2017-02-24 NOTE — Plan of Care (Signed)
  Progressing Elimination: Will not experience complications related to bowel motility 02/24/2017 0557 - Progressing by Burtis Junesrewery, Jameya Pontiff, RN Note Incontinent BM overnight  Pain Managment: General experience of comfort will improve 02/24/2017 0557 - Progressing by Burtis Junesrewery, Caitlinn Klinker, RN Note Patient complains of pain with movement Safety: Ability to remain free from injury will improve 02/24/2017 0557 - Progressing by Burtis Junesrewery, Disa Riedlinger, RN

## 2017-02-24 NOTE — Progress Notes (Addendum)
Pt has second occurrence of diarrhea today, this time reddish and orange in color. Pt on lactulose, last Hemoglobin 7.2.  Blount, NP paged.  Instruction given to monitor pt and notify provider if significant changes in amount of BM noted.

## 2017-02-25 LAB — CBC
HCT: 27.4 % — ABNORMAL LOW (ref 36.0–46.0)
Hemoglobin: 8.9 g/dL — ABNORMAL LOW (ref 12.0–15.0)
MCH: 28.7 pg (ref 26.0–34.0)
MCHC: 32.5 g/dL (ref 30.0–36.0)
MCV: 88.4 fL (ref 78.0–100.0)
Platelets: 88 10*3/uL — ABNORMAL LOW (ref 150–400)
RBC: 3.1 MIL/uL — AB (ref 3.87–5.11)
RDW: 21.9 % — ABNORMAL HIGH (ref 11.5–15.5)
WBC: 17.8 10*3/uL — AB (ref 4.0–10.5)

## 2017-02-25 LAB — CBC WITH DIFFERENTIAL/PLATELET
BASOS PCT: 0 %
Basophils Absolute: 0 10*3/uL (ref 0.0–0.1)
Eosinophils Absolute: 0.2 10*3/uL (ref 0.0–0.7)
Eosinophils Relative: 1 %
HEMATOCRIT: 20 % — AB (ref 36.0–46.0)
HEMOGLOBIN: 6.2 g/dL — AB (ref 12.0–15.0)
LYMPHS PCT: 9 %
Lymphs Abs: 1.9 10*3/uL (ref 0.7–4.0)
MCH: 29 pg (ref 26.0–34.0)
MCHC: 31 g/dL (ref 30.0–36.0)
MCV: 93.5 fL (ref 78.0–100.0)
MONOS PCT: 8 %
Monocytes Absolute: 1.7 10*3/uL — ABNORMAL HIGH (ref 0.1–1.0)
NEUTROS ABS: 17.7 10*3/uL — AB (ref 1.7–7.7)
Neutrophils Relative %: 82 %
Platelets: 105 10*3/uL — ABNORMAL LOW (ref 150–400)
RBC: 2.14 MIL/uL — ABNORMAL LOW (ref 3.87–5.11)
RDW: 21.8 % — ABNORMAL HIGH (ref 11.5–15.5)
WBC: 21.5 10*3/uL — ABNORMAL HIGH (ref 4.0–10.5)

## 2017-02-25 LAB — BASIC METABOLIC PANEL
ANION GAP: 7 (ref 5–15)
BUN: 25 mg/dL — ABNORMAL HIGH (ref 6–20)
CALCIUM: 8.4 mg/dL — AB (ref 8.9–10.3)
CO2: 20 mmol/L — AB (ref 22–32)
Chloride: 112 mmol/L — ABNORMAL HIGH (ref 101–111)
Creatinine, Ser: 1.28 mg/dL — ABNORMAL HIGH (ref 0.44–1.00)
GFR calc Af Amer: 57 mL/min — ABNORMAL LOW (ref >60)
GFR calc non Af Amer: 49 mL/min — ABNORMAL LOW (ref >60)
GLUCOSE: 126 mg/dL — AB (ref 65–99)
POTASSIUM: 3.1 mmol/L — AB (ref 3.5–5.1)
Sodium: 139 mmol/L (ref 135–145)

## 2017-02-25 LAB — ABO/RH: ABO/RH(D): AB POS

## 2017-02-25 LAB — AMMONIA: Ammonia: 71 umol/L — ABNORMAL HIGH (ref 9–35)

## 2017-02-25 LAB — PREPARE RBC (CROSSMATCH)

## 2017-02-25 MED ORDER — OXYCODONE HCL 5 MG PO TABS
5.0000 mg | ORAL_TABLET | Freq: Four times a day (QID) | ORAL | Status: DC | PRN
  Administered 2017-02-25 – 2017-02-26 (×3): 10 mg via ORAL
  Filled 2017-02-25 (×3): qty 2

## 2017-02-25 MED ORDER — LACTULOSE 10 GM/15ML PO SOLN
30.0000 g | Freq: Every day | ORAL | Status: DC
  Administered 2017-02-26: 30 g via ORAL
  Filled 2017-02-25: qty 45

## 2017-02-25 MED ORDER — POTASSIUM CHLORIDE 20 MEQ PO PACK
40.0000 meq | PACK | Freq: Once | ORAL | Status: AC
  Administered 2017-02-25: 40 meq via ORAL
  Filled 2017-02-25: qty 2

## 2017-02-25 MED ORDER — SODIUM CHLORIDE 0.9 % IV SOLN: Freq: Once | INTRAVENOUS | Status: DC

## 2017-02-25 NOTE — Progress Notes (Signed)
Debbie Barker 3:23 PM  Subjective: Patient seen and examined and her hospital computer chart reviewed and her case discussed with the hospital team and her nurse and currently she is still getting round-the-clock pain medicine and can follow some commands but does not answer questions and she had a 1 g hemoglobin drop without change in BUN and has mostly yellow stool but there is some red or orange tinge with it And in discussion with the nurse is hard to know if this is coming from her GI track or her wound  Objective: Vital signs stable afebrile mental status as above follow simple commands does not answer questions some obvious ascites soft nontender yellow stool only right now and hip bandage with discharge with some red and orange discoloration labs reviewed hemoglobin 1 g drop BUN unchanged as above INR stable liver tests stable yesterday ammonia essentially unchanged  Assessment: Cirrhosis and recent hip surgery  Plan: Doubt currently significant GI bleeding will check on tomorrow and await palliative care consult weaning pain medicine as soon as possible will help determine her mental status and degree of encephalopathy  [facility],Daria Mcmeekin E  Pager [phone] After 5PM or if no answer call [phone]

## 2017-02-25 NOTE — Progress Notes (Signed)
PROGRESS NOTE  Debbie Barker ZOX:[id number] DOB: 16-Feb-1970 DOA: 02/15/2017 PCP: Lavinia Sharps, NP  Brief Narrative: 46yow PMH alcoholic cirrhosis, complicated history, presented with fall and leg pain as well as hematemesis. Admitted for acute GIB, alcohol abuse, AKI, right hip fx. Treated with Protonix and octreotide infusions. S/p EGD 2/15 showing recently bleeding varices. Varices banded. Seen by orthopedics and s/p femur nail 2/17.  Assessment/Plan Acute hepatic encephalopathy -No significant improvement, in fact ammonia is trending up despite aggressive lactulose and Xifaxan.  Decompensated cirrhosis (alcohol, hepatitis C), esophageal varices, hepatic encephalopathy, thrombocytopenia, coagulopathy, hyponatremia. MELD 23.  14-15% 66-month mortality.  Not a candidate for prednisone secondary to GIB. -Abdomen is somewhat distended but no significant ascites.  Unable to obtain paracentesis. -Clinical condition is clearly worsening in survival is in doubt.  I discussed again with her brother at bedside that she may not survive this hospitalization.  Palliative meeting is planned for tomorrow.  Unless there is dramatic improvement, I think hospice would be quite reasonable.  Diffuse abdominal pain with distention.  No ascites on ultrasound. -- Suspect secondary to frequent bowel movements.  Will check abdominal x-ray to exclude ileus. -SBP still in differential although limited ascites.  Continue antibiotics. -CBC in AM  Acute esophageal variceal bleed. Treated with Protonix and octreotide infusions. S/p EGD with treatment 2/15.  - Hemoglobin dropped overnight.  Unclear whether there is been any further bleeding.  Currently receiving transfusion 2 units PRBC. -Trend hemoglobin - Repeat EGD for further band ligation in 3-4 weeks. - BID Protonix 3 weeks then daily   ABLA. Secondary to above. Hgb 3.2 on admission. S/p 4 units PRBC since admission, last 2/16.   -Plan as above  Closed right  intertrochanteric femur fracture. S/p surgery 2/17 - WBAT RLE. Ortho rec against anticoagulation  - f/u with Dr. Everardo Pacific end of February oral candidiasis -nurse wonders whether he may be drainage from the wound.  Orthopedics has been called to provide reevaluation of wound  Oral candidiasis -Continue nystatin  Alcohol abuse, ongoing - no evidence of withdrawal   AKI, suspect CKD stage III -Suspect renal function is now at baseline.  Right 10th rib fracture - no intervention suggestion  Hepatitis C - follow-up with GI as an outpatient  PMH chronic pancreatitis   As above discussed with brother at bedside, also discussed with him last evening.  He is the de Armed forces technical officer as both parents are dead and the patient is estranged from her children.  He seems to have a realistic understanding of prognosis  DVT prophylaxis: SCDs Code Status: full Family Communication:  Disposition Plan: SNF    Brendia Sacks, MD  [facility] Direct contact: [phone] --Via amion app OR  --[email or web address]; password [email or web address]  7PM-7AM contact night coverage as above 02/25/2017, 1:55 PM  LOS: 10 days   Consultants:  GI  Orthopedics  CIR  Procedures:  EGD Impression:               - Recently bleeding large (> 5 mm) esophageal                            varices. Completely eradicated. Banded.                           - Portal hypertensive gastropathy.                           -  Portal hypertensive gastropathy.                           - Normal duodenal bulb, first portion of the                            duodenum and second portion of the duodenum.                           - No specimens collected.   2/17 Short gamma nail r femur  2/18 PICC insertion 02/17/17 0806  Transfuse RBC Tranfuse 1 unit (1 of 1 released)    02/17/17 0806  02/16/17 1116  Transfuse RBC Tranfuse 1 unit (1 of 1 released)    02/16/17 1116  02/15/17 0730  Transfuse RBC Tranfuse 2 units (2 of 2  released)          Antimicrobials:  Azactam 02/15/2017 >> 2/20   Aztreonam 2/22 >>  Interval history/Subjective: 2 stools overnight which were read and thought to be blood per nursing.  Continues to complain of abdominal pain.  Might be having drainage from right thigh wound, nursing not sure.  Objective: Vitals:  Vitals:   02/25/17 1149 02/25/17 1210  BP: 93/72 106/78  Pulse: (!) 116 (!) 119  Resp: (!) 22 (!) 22  Temp: 98.3 F (36.8 C) 98.3 F (36.8 C)  SpO2: 97% 93%    Exam:  Constitutional:   . Appears calm and comfortable Respiratory:  . CTA bilaterally, no w/r/r.  . Respiratory effort normal Cardiovascular:  . RRR, no m/r/g . No LLE extremity edema.  Decreasing right lower extremity edema. Abdomen:  . Distended, soft, nontender, positive bowel sounds Psychiatric:  . Mental status . Currently sleeping    I have personally reviewed the following:   Labs:  Potassium 3.1, creatinine stable, 1.28   Hemoglobin down to 6.2.  WBC down to 21.5.  Platelets down, 105  Ammonia 72 > 81 > 56 > 71  Scheduled Meds: . Chlorhexidine Gluconate Cloth  6 each Topical Q12H  . feeding supplement (ENSURE ENLIVE)  237 mL Oral BID BM  . folic acid  1 mg Oral Daily  . lactulose  30 g Oral TID  . mouth rinse  15 mL Mouth Rinse BID  . multivitamin with minerals  1 tablet Oral Daily  . nystatin  5 mL Oral QID  . pantoprazole  40 mg Oral BID  . rifaximin  550 mg Oral BID  . sodium chloride flush  3 mL Intravenous Q12H  . thiamine  100 mg Oral Daily   Continuous Infusions: . sodium chloride    . sodium chloride    . albumin human    . aztreonam Stopped (02/25/17 [id number])    Principal Problem:   Acute upper GI bleeding Active Problems:   Alcoholic cirrhosis of liver with ascites (HCC)   Secondary esophageal varices with bleeding (HCC)   AKI (acute kidney injury) (HCC)   Alcohol abuse   Acute on chronic alcoholic liver disease (HCC)   Acute upper GI bleed    Thrombocytopenia (HCC)   Acute hepatitis C virus infection without hepatic coma   Acute blood loss anemia   Anemia of chronic disease   Benign essential HTN   Ascites due to alcoholic cirrhosis (HCC)   Pain   Acute GI bleeding   LOS: 10 days  Plan greater than 35 minutes, >50% in counseling coordination of care, discussion with brother at bedside, discussion with Dr. Ewing Schlein

## 2017-02-25 NOTE — Progress Notes (Signed)
Reviewed chart, examined patient, met brother at bedside.  Scheduled [place] meeting for 9:00 am on 2/25.   Florentina Jenny, PA-C Palliative Medicine Pager: [phone]   No charge note.

## 2017-02-25 NOTE — Progress Notes (Signed)
Orthopedic doctor paged to see patient for drainage from hip incision. Per ortho they will see patient on Monday morning.

## 2017-02-25 NOTE — Progress Notes (Signed)
CRITICAL VALUE ALERT  Critical Value:  Hemoglobin 6.2  Date & Time Notied:  02/25/17 at 0503  Provider Notified: Bruna PotterBlount, NP  Orders Received/Actions taken: 2 units of blood ordered

## 2017-02-26 DIAGNOSIS — Z7189 Other specified counseling: Secondary | ICD-10-CM

## 2017-02-26 DIAGNOSIS — S72141A Displaced intertrochanteric fracture of right femur, initial encounter for closed fracture: Secondary | ICD-10-CM

## 2017-02-26 DIAGNOSIS — Z515 Encounter for palliative care: Secondary | ICD-10-CM

## 2017-02-26 DIAGNOSIS — N179 Acute kidney failure, unspecified: Secondary | ICD-10-CM

## 2017-02-26 DIAGNOSIS — D689 Coagulation defect, unspecified: Secondary | ICD-10-CM

## 2017-02-26 LAB — BPAM RBC
Blood Product Expiration Date: 201902262359
Blood Product Expiration Date: 201902262359
ISSUE DATE / TIME: 201902240718
ISSUE DATE / TIME: 201902241144
UNIT TYPE AND RH: 600
Unit Type and Rh: 600

## 2017-02-26 LAB — TYPE AND SCREEN
ABO/RH(D): AB POS
Antibody Screen: NEGATIVE
Unit division: 0
Unit division: 0

## 2017-02-26 LAB — CBC
HCT: 27.1 % — ABNORMAL LOW (ref 36.0–46.0)
Hemoglobin: 8.7 g/dL — ABNORMAL LOW (ref 12.0–15.0)
MCH: 28.6 pg (ref 26.0–34.0)
MCHC: 32.1 g/dL (ref 30.0–36.0)
MCV: 89.1 fL (ref 78.0–100.0)
PLATELETS: 70 10*3/uL — AB (ref 150–400)
RBC: 3.04 MIL/uL — ABNORMAL LOW (ref 3.87–5.11)
RDW: 22.8 % — AB (ref 11.5–15.5)
WBC: 16.8 10*3/uL — ABNORMAL HIGH (ref 4.0–10.5)

## 2017-02-26 MED ORDER — NYSTATIN 100000 UNIT/ML MT SUSP: 5.0000 mL | Freq: Four times a day (QID) | OROMUCOSAL | 0 refills | Status: DC

## 2017-02-26 MED ORDER — LORAZEPAM 2 MG/ML IJ SOLN
0.5000 mg | Freq: Two times a day (BID) | INTRAMUSCULAR | Status: DC
  Administered 2017-02-26: 0.5 mg via INTRAVENOUS
  Filled 2017-02-26: qty 1

## 2017-02-26 MED ORDER — MORPHINE SULFATE (PF) 4 MG/ML IV SOLN
2.0000 mg | INTRAVENOUS | Status: DC | PRN
  Administered 2017-02-26: 2 mg via INTRAVENOUS
  Filled 2017-02-26: qty 1

## 2017-02-26 MED ORDER — SODIUM CHLORIDE 0.9 % IV SOLN: 250.0000 mL | INTRAVENOUS | Status: DC | PRN

## 2017-02-26 MED ORDER — MORPHINE SULFATE (PF) 2 MG/ML IV SOLN: 2.0000 mg | INTRAVENOUS | Status: DC | PRN

## 2017-02-26 MED ORDER — ONDANSETRON HCL 4 MG/2ML IJ SOLN: 4.0000 mg | Freq: Four times a day (QID) | INTRAMUSCULAR | Status: DC | PRN

## 2017-02-26 MED ORDER — SODIUM CHLORIDE 0.9% FLUSH: 3.0000 mL | INTRAVENOUS | Status: DC | PRN

## 2017-02-26 MED ORDER — PANTOPRAZOLE SODIUM 40 MG PO TBEC: 40.0000 mg | DELAYED_RELEASE_TABLET | Freq: Two times a day (BID) | ORAL | Status: AC

## 2017-02-26 MED ORDER — HALOPERIDOL LACTATE 5 MG/ML IJ SOLN: 0.5000 mg | INTRAMUSCULAR | Status: DC | PRN

## 2017-02-26 MED ORDER — GLYCOPYRROLATE 1 MG PO TABS: 1.0000 mg | ORAL_TABLET | ORAL | Status: DC | PRN

## 2017-02-26 MED ORDER — GLYCOPYRROLATE 0.2 MG/ML IJ SOLN: 0.2000 mg | INTRAMUSCULAR | Status: DC | PRN

## 2017-02-26 MED ORDER — OXYCODONE HCL 5 MG PO TABS
5.0000 mg | ORAL_TABLET | Freq: Four times a day (QID) | ORAL | Status: DC
  Administered 2017-02-26: 5 mg via ORAL
  Filled 2017-02-26 (×2): qty 1

## 2017-02-26 MED ORDER — HEPARIN SOD (PORK) LOCK FLUSH 100 UNIT/ML IV SOLN
250.0000 [IU] | INTRAVENOUS | Status: AC | PRN
  Administered 2017-02-26: 250 [IU]

## 2017-02-26 MED ORDER — SODIUM CHLORIDE 0.9% FLUSH: 3.0000 mL | Freq: Two times a day (BID) | INTRAVENOUS | Status: DC

## 2017-02-26 MED ORDER — HALOPERIDOL 0.5 MG PO TABS
0.5000 mg | ORAL_TABLET | ORAL | Status: DC | PRN
  Filled 2017-02-26: qty 1

## 2017-02-26 MED ORDER — POLYVINYL ALCOHOL 1.4 % OP SOLN
1.0000 [drp] | Freq: Four times a day (QID) | OPHTHALMIC | Status: DC | PRN
Start: 2017-02-26 — End: 2017-02-26
  Filled 2017-02-26: qty 15

## 2017-02-26 MED ORDER — LACTULOSE 10 GM/15ML PO SOLN: 30.0000 g | Freq: Every day | ORAL | 0 refills | Status: AC

## 2017-02-26 MED ORDER — BIOTENE DRY MOUTH MT LIQD: 15.0000 mL | OROMUCOSAL | Status: DC | PRN

## 2017-02-26 MED ORDER — HALOPERIDOL LACTATE 2 MG/ML PO CONC
0.5000 mg | ORAL | Status: DC | PRN
Start: 2017-02-26 — End: 2017-02-26
  Filled 2017-02-26: qty 0.3

## 2017-02-26 MED ORDER — ONDANSETRON 4 MG PO TBDP: 4.0000 mg | ORAL_TABLET | Freq: Four times a day (QID) | ORAL | Status: DC | PRN

## 2017-02-26 NOTE — Progress Notes (Signed)
Physical Therapy Treatment Patient Details Name: Debbie Barker MRN: [id number] DOB: 09-20-1970 Today's Date: 02/26/2017    History of Present Illness Debbie Barker is a 47 y.o. female with medical history significant of alcoholic cirrhosis complicated by chronic pancreatitis, esophageal varices with prior GI bleeds, ascites, active alcohol abuse, depression who comes in with fall and severe leg pain in the setting of presyncopal episode after several days of hematemesis. Pt underwent IM nail R femur (though procedure was delayed due to pt's low hgb). Pt recently d/c'ed from psych care in [facility].     PT Comments    Pt very restless this session due to R hip pain. +2 min assist to transfer to EOB for breakfast. She had no interest in eating but was willing to drink orange juice. Pt able to sit x 5-7 minutes. Requiring return to supine due to R hip pain. Pt following simple commands inconsistently. She tolerated minimal LE AAROM exercises in supine. Pallative care meeting scheduled for this AM. PT to continue to follow.   Follow Up Recommendations  SNF     Equipment Recommendations  Rolling walker with 5" wheels    Recommendations for Other Services       Precautions / Restrictions Precautions Precautions: Fall Restrictions RLE Weight Bearing: Weight bearing as tolerated    Mobility  Bed Mobility Overal bed mobility: Needs Assistance Bed Mobility: Supine to Sit;Sit to Supine     Supine to sit: +2 for physical assistance;Min assist Sit to supine: Mod assist   General bed mobility comments: assist with BLE and to elevate trunk. Total assist to scoot up in bed with use of bed pad.  Transfers                 General transfer comment: unsafe to attempt  Ambulation/Gait             General Gait Details: unable   Stairs            Wheelchair Mobility    Modified Rankin (Stroke Patients Only)       Balance Overall balance assessment: Needs  assistance Sitting-balance support: No upper extremity supported;Feet supported Sitting balance-Leahy Scale: Fair Sitting balance - Comments: min guard assist                                    Cognition Arousal/Alertness: Awake/alert Behavior During Therapy: Flat affect;Restless Overall Cognitive Status: Impaired/Different from baseline Area of Impairment: Memory;Attention;Following commands;Safety/judgement;Problem solving;Orientation                 Orientation Level: Disoriented to;Time;Situation;Place Current Attention Level: Focused Memory: Decreased recall of precautions;Decreased short-term memory Following Commands: Follows one step commands with increased time;Follows one step commands inconsistently Safety/Judgement: Decreased awareness of safety;Decreased awareness of deficits   Problem Solving: Slow processing;Decreased initiation;Requires verbal cues;Requires tactile cues;Difficulty sequencing General Comments: pt unaware of urinary incontinence with wet bed and gown      Exercises General Exercises - Lower Extremity Ankle Circles/Pumps: AROM;Both;10 reps;Supine Heel Slides: AAROM;Right;Left;10 reps;Supine Hip ABduction/ADduction: AAROM;Left;10 reps;Supine    General Comments        Pertinent Vitals/Pain Pain Assessment: Faces Faces Pain Scale: Hurts whole lot Pain Location: R hip Pain Descriptors / Indicators: Grimacing;Discomfort;Guarding Pain Intervention(s): Limited activity within patient's tolerance;Monitored during session;Patient requesting pain meds-RN notified    Home Living  Prior Function            PT Goals (current goals can now be found in the care plan section) Acute Rehab PT Goals Patient Stated Goal: to drink OJ PT Goal Formulation: Patient unable to participate in goal setting Time For Goal Achievement: 03/05/17 Potential to Achieve Goals: Good Progress towards PT goals: Not  progressing toward goals - comment(multiple medical issues, cognition)    Frequency    Min 3X/week      PT Plan Current plan remains appropriate    Co-evaluation PT/OT/SLP Co-Evaluation/Treatment: Yes Reason for Co-Treatment: For patient/therapist safety;Necessary to address cognition/behavior during functional activity PT goals addressed during session: Mobility/safety with mobility;Balance OT goals addressed during session: ADL's and self-care      AM-PAC PT "6 Clicks" Daily Activity  Outcome Measure  Difficulty turning over in bed (including adjusting bedclothes, sheets and blankets)?: A Little Difficulty moving from lying on back to sitting on the side of the bed? : A Lot Difficulty sitting down on and standing up from a chair with arms (e.g., wheelchair, bedside commode, etc,.)?: Unable Help needed moving to and from a bed to chair (including a wheelchair)?: Total Help needed walking in hospital room?: Total Help needed climbing 3-5 steps with a railing? : Total 6 Click Score: 9    End of Session   Activity Tolerance: Patient limited by pain;Other (comment)(cognition) Patient left: with family/visitor present;in bed;with call bell/phone within reach;with bed alarm set Nurse Communication: Mobility status PT Visit Diagnosis: Unsteadiness on feet (R26.81);Muscle weakness (generalized) (M62.81);Repeated falls (R29.6);Pain;Difficulty in walking, not elsewhere classified (R26.2) Pain - Right/Left: Right Pain - part of body: Hip     Time: [id number] PT Time Calculation (min) (ACUTE ONLY): 24 min  Charges:  $Therapeutic Activity: 8-22 mins                    G Codes:       Aida RaiderWendy Mairim Bade, PT  Office # [phone] Pager [phone]    Ilda FoilGarrow, Debbie Liwanag Rene 02/26/2017, 9:39 AM

## 2017-02-26 NOTE — Progress Notes (Signed)
ORTHOPAEDIC PROGRESS NOTE  s/p Procedure(s): INTRAMEDULLARY (IM) NAIL INTERTROCHANTRIC  SUBJECTIVE: No complaints specifically this morning but has not been conversant, encephalopathic per family.  Brother states she has been rolling on this side had had significant diarrhea over weekend.  RN team called about continued drainage from incisions.  OBJECTIVE: PE: RLE: scattered ecchymosis around lateral leg as would be expected with patient history.  Some moderate serous drainage noted but no purulence, no expressable drainge, leg lengths equal, warm well perfused foot, intact EHL/TA/GSC   Vitals:   02/26/17 0522 02/26/17 0545  BP: (!) 108/53   Pulse: 81   Resp:    Temp: 97.9 F (36.6 C)   SpO2: 90% 95%     ASSESSMENT: Debbie Barker is a 47 y.o. female with guarded overall progress due to complex medical conditions.  I spoke with brother and RN this morning about plan.  Recommend wound care to apply incisional vac to 3 incisions to contain any serous drainage and avoid potential for infection.  Palliative care consult pending this morning and if discussion/decision is for palliative care only would defer to their recommendations on whether to keep vac or not at that point but feel it is the safest and easiest treatment for the patient.    PLAN: Weightbearing: WBAT RLE Insicional and dressing care: Incisional vac on RLE incisions per wound care, will consult today.  Call [phone] to speak with me directly about questions. Orthopedic device(s): None Showering: PRN VTE prophylaxis: Not indicated per ortho as patient INR around 2 at baseline.  Defer to medicine  Pain control: PRN meds.  Follow - up plan: 2 weeks Contact information:  Weekdays 8-5 Ramond Marrowax Alejandro Gamel MD [phone], After hours and holidays please check [email or web address] for group call information for [facility]

## 2017-02-26 NOTE — Discharge Summary (Addendum)
Physician Discharge Summary  Debbie MawDena Barker YNW:[id number] DOB: 1970/05/30 DOA: 02/15/2017  PCP: [name], Debbie Ann, NP  Admit date: 02/15/2017 Discharge date: 02/26/2017  Recommendations for Outpatient Follow-up:   Medications for pain and anxiety as per hospice (discussed with hospice, they will provide).  Closed right intertrochanteric femur fracture. S/p surgery 2/17 - WBATRLE.  - f/u with Dr. Everardo PacificVarkey end of February if clinically indicated -some drainage from wound noted, felt to be serous in nature incisional VAC was recommended but given GOC was not placed  Thrush treated with nystatin, which is on national backorder. Consider alternative treatment if indicated   Follow-up Information    Debbie PippinVarkey, Debbie T, MD Follow up in 2 week(s).   Specialty:  Orthopedic Surgery Contact information: [address] [place] [address] [phone]            Discharge Diagnoses:  1. Decompensated cirrhosis (alcohol, hepatitis C), esophageal varices, hepatic encephalopathy, thrombocytopenia, coagulopathy, hyponatremia. 2. Acute hepatic encephalopathy 3. Diffuse abdominal pain with distention 4. Acute esophageal variceal bleed 5. ABLA 6. Closed right intertrochanteric femur fracture 7. Alcohol abuse, ongoing 8. AKI, suspect CKD stage III 9. Right 10th rib fracture 10. Hepatitis C  Discharge Condition: stable but suspect terminal < 2 weeks Disposition: residential hospice  Diet recommendation: as desired  Filed Weights   02/15/17 2106 02/16/17 0045 02/17/17 1858  Weight: 72.6 kg (160 lb) 62.3 kg (137 lb 5.6 oz) 69.8 kg (153 lb 14.1 oz)    History of present illness:  46yow PMH alcoholic cirrhosis, complicated history, presented with fall and leg pain as well as hematemesis. Admitted for acute GIB, alcohol abuse, AKI, right hip fx.   Hospital Course:  Patient was treated with Protonix and octreotide infusions, seen by gastroenterology and underwent EGD which showed  recently bleeding varices which were banded.  Bleeding subsequently stopped.  Patient was also seen by orthopedics and underwent nailing of the femur.  She was treated for decompensated cirrhosis and acute hepatic encephalopathy and  despite maximal medical treatment her condition has continued to decline. Chance of recovery minimal. After discussions with brother with palliative medicine, family has elected to pursue residential hospice.  Individual issues as below.    Acute hepatic encephalopathy -No significant improvement, in fact ammonia is trending up despite aggressive lactulose and Xifaxan.  Decompensated cirrhosis (alcohol, hepatitis C), esophageal varices, hepatic encephalopathy, thrombocytopenia, coagulopathy, hyponatremia. MELD 23.  14-15% 1754-month mortality.  Not a candidate for prednisone secondary to GIB. - ammonia and encephalopathy worsening despite maximal medical treatment - plan for hospice  Diffuse abdominal pain with distention.  No ascites on ultrasound. -- Suspect secondary to frequent bowel movements --SBP was considered, but given GOC no treatment recommended at this point.  Acute esophageal variceal bleed. Treated with Protonix and octreotide infusions. S/p EGD with treatment 2/15.  - had some recurrent bleeding treated with PRBC transfusion only - Repeat EGD for further band ligation in 3-4 weeks if patient improves - BID Protonix 3 weeks then daily   ABLA. Secondary to above. Hgb 3.2 on admission. S/p total 6 units PRBC.  Closed right intertrochanteric femur fracture. S/p surgery 2/17 - WBATRLE. Ortho rec against anticoagulation  - f/u with Dr. Everardo PacificVarkey end of February if clinically indicated -some drainage from wound noted, felt to be serous in nature incisional VAC was recommended but given GOC was not placed  Alcohol abuse, ongoing - no evidence of withdrawal   AKI, suspect CKD stage III -Suspect renal function is at  baseline.  Right 10th rib  fracture - no intervention suggestion  Hepatitis C   Consultants:  GI  Orthopedics  CIR  Procedures:  EGD Impression: - Recently bleeding large (>5 mm) esophageal  varices. Completely eradicated. Banded. - Portal hypertensive gastropathy. - Portal hypertensive gastropathy. - Normal duodenal bulb, first portion of the  duodenum and second portion of the duodenum. - No specimens collected.   2/17 Short gamma nail r femur  2/18 PICC insertion 02/17/17 0806  Transfuse RBC Tranfuse 1 unit (1 of 1 released)    02/17/17 0806  02/16/17 1116  Transfuse RBC Tranfuse 1 unit (1 of 1 released)    02/16/17 1116  02/15/17 0730  Transfuse RBC Tranfuse 2 units (2 of 2 released)          Antimicrobials:  Azactam 02/15/2017 >> 2/20   Aztreonam 2/22 >>2/25  Today's assessment: S: encephalopathic, not able to provide history O: Vitals:  Vitals:   02/26/17 0522 02/26/17 0545  BP: (!) 108/53   Pulse: 81   Resp:    Temp: 97.9 F (36.6 C)   SpO2: 90% 95%    Constitutional:  . Appears restless, ill.  Minimally responsive  Respiratory:  . CTA bilaterally, no w/r/r.  . Respiratory effort normal.  Cardiovascular:  . RRR, no m/r/g Abdomen:  . Distended, nontender Psychiatric:  o Somewhat awake but clearly confused and not able to provide a reliable history.    Discharge Instructions  Discharge Instructions    Discharge instructions   Complete by:  As directed    Debbie Marrow MD, MPH [facility] [address], [address] [phone] (tel)   [phone] (fax)   POST-OPERATIVE INSTRUCTIONS   WOUND CARE  You may shower on Post-Op Day #4. Dressing can be removed and ok to shower and dry dressing can be placed as you see fit if there is any  drainage.  EXERCISES Follow the instructions of your therapist.  No specific exercises necessary outside of this.  POST-OP MEDICINES Oxycodone - This is a strong narcotic, to be used only on an "as needed" basis for pain. Due to your liver issues your blood is more thin than average and we cannot use any additional measures safely for DVT prophylaxis  FOLLOW-UP If you develop a Fever (<101.5), Redness or Drainage from the surgical incision site, please call our office to arrange for an evaluation. Please call the office to schedule a follow-up appointment for your suture removal, 10-14 days post-operatively.  IF YOU HAVE ANY QUESTIONS, PLEASE FEEL FREE TO CALL OUR OFFICE.  HELPFUL INFORMATION You should wean off your narcotic medicines as soon as you are able.  Most patients will be off or using minimal narcotics before their first postop appointment.   We suggest you use the pain medication the first night prior to going to bed, in order to ease any pain when the anesthesia wears off. You should avoid taking pain medications on an empty stomach as it will make you nauseous.  Do not drink alcoholic beverages or take illicit drugs when taking pain medications.  Pain medication may make you constipated.  Below are a few solutions to try in this order: Decrease the amount of pain medication if you aren't having pain. Drink lots of decaffeinated fluids. Drink prune juice and/or each dried prunes  If the first 3 don't work start with additional solutions Take Colace - an over-the-counter stool softener Take Senokot - an over-the-counter laxative Take Miralax - a stronger  over-the-counter laxative     Allergies as of 02/26/2017      Reactions   Penicillins    UNSPECIFIED REACTION  Has patient had a PCN reaction causing immediate rash, facial/tongue/throat swelling, SOB or lightheadedness with hypotension: Unknown Has patient had a PCN reaction causing severe rash involving mucus  membranes or skin necrosis: Unknown Has patient had a PCN reaction that required hospitalization: Unknown Has patient had a PCN reaction occurring within the last 10 years: No If all of the above answers are "NO", then may proceed with Cephalosporin use.      Medication List    STOP taking these medications   carvedilol 6.25 MG tablet Commonly known as:  COREG   furosemide 20 MG tablet Commonly known as:  LASIX   gabapentin 300 MG capsule Commonly known as:  NEURONTIN   omeprazole 20 MG capsule Commonly known as:  PRILOSEC   OMEPRAZOLE PO   PROZAC PO     TAKE these medications   albuterol 108 (90 Base) MCG/ACT inhaler Commonly known as:  PROVENTIL HFA;VENTOLIN HFA Inhale 1-2 puffs into the lungs every 6 (six) hours as needed for wheezing or shortness of breath.   lactulose 10 GM/15ML solution Commonly known as:  CHRONULAC Take 45 mLs (30 g total) by mouth daily. Start taking on:  02/27/2017   pantoprazole 40 MG tablet Commonly known as:  PROTONIX Take 1 tablet (40 mg total) by mouth 2 (two) times daily.      Allergies  Allergen Reactions  . Penicillins     UNSPECIFIED REACTION   Has patient had a PCN reaction causing immediate rash, facial/tongue/throat swelling, SOB or lightheadedness with hypotension: Unknown Has patient had a PCN reaction causing severe rash involving mucus membranes or skin necrosis: Unknown Has patient had a PCN reaction that required hospitalization: Unknown Has patient had a PCN reaction occurring within the last 10 years: No If all of the above answers are "NO", then may proceed with Cephalosporin use.     The results of significant diagnostics from this hospitalization (including imaging, microbiology, ancillary and laboratory) are listed below for reference.    Significant Diagnostic Studies: Dg Chest 2 View  Result Date: 02/15/2017 CLINICAL DATA:  Chest pain.  Fall. EXAM: CHEST  2 VIEW COMPARISON:  02/04/2017 FINDINGS: The heart  size and mediastinal contours are within normal limits. Both lungs are clear. There is an acute fracture involving the anterior aspect of the right tenth rib. IMPRESSION: 1. Right tenth rib fracture. 2. No active cardiopulmonary abnormalities. Electronically Signed   By: Signa Kell M.D.   On: 02/15/2017 08:24   Ct Head Wo Contrast  Result Date: 02/15/2017 CLINICAL DATA:  Fall.  Slipped on floor. EXAM: CT HEAD WITHOUT CONTRAST CT CERVICAL SPINE WITHOUT CONTRAST TECHNIQUE: Multidetector CT imaging of the head and cervical spine was performed following the standard protocol without intravenous contrast. Multiplanar CT image reconstructions of the cervical spine were also generated. COMPARISON:  08/12/2013 FINDINGS: CT HEAD FINDINGS Brain: No evidence of acute infarction, hemorrhage, hydrocephalus, extra-axial collection or mass lesion/mass effect. Vascular: No hyperdense vessel or unexpected calcification. Skull: Normal. Negative for fracture or focal lesion. Sinuses/Orbits: The mastoid air cells are clear. Partial opacification of right posterior ethmoid air cell. Other: None CT CERVICAL SPINE FINDINGS Alignment: Normal. Skull base and vertebrae: No acute fracture. No primary bone lesion or focal pathologic process. Soft tissues and spinal canal: No prevertebral fluid or swelling. No visible canal hematoma. Disc levels:  Unremarkable. Upper chest: Negative.  Other: None IMPRESSION: 1. Normal brain. 2. No evidence for cervical spine fracture or dislocation. Electronically Signed   By: Signa Kell M.D.   On: 02/15/2017 07:37   Ct Cervical Spine Wo Contrast  Result Date: 02/15/2017 CLINICAL DATA:  Fall.  Slipped on floor. EXAM: CT HEAD WITHOUT CONTRAST CT CERVICAL SPINE WITHOUT CONTRAST TECHNIQUE: Multidetector CT imaging of the head and cervical spine was performed following the standard protocol without intravenous contrast. Multiplanar CT image reconstructions of the cervical spine were also generated.  COMPARISON:  08/12/2013 FINDINGS: CT HEAD FINDINGS Brain: No evidence of acute infarction, hemorrhage, hydrocephalus, extra-axial collection or mass lesion/mass effect. Vascular: No hyperdense vessel or unexpected calcification. Skull: Normal. Negative for fracture or focal lesion. Sinuses/Orbits: The mastoid air cells are clear. Partial opacification of right posterior ethmoid air cell. Other: None CT CERVICAL SPINE FINDINGS Alignment: Normal. Skull base and vertebrae: No acute fracture. No primary bone lesion or focal pathologic process. Soft tissues and spinal canal: No prevertebral fluid or swelling. No visible canal hematoma. Disc levels:  Unremarkable. Upper chest: Negative. Other: None IMPRESSION: 1. Normal brain. 2. No evidence for cervical spine fracture or dislocation. Electronically Signed   By: Signa Kell M.D.   On: 02/15/2017 07:37   US Abdomen Complete  Result Date: 02/15/2017 CLINICAL DATA:  Liver failure. EXAM: ABDOMEN ULTRASOUND COMPLETE COMPARISON:  CT scan 02/04/2017 FINDINGS: Gallbladder: Surgically absent. Common bile duct: Diameter: 5.8 mm Liver: Cirrhotic changes involving the liver with confluent hepatic fibrosis and regenerating nodules. No obvious focal hepatic lesion. IVC: Normal caliber. Pancreas: Not visualized. Spleen: Normal size.  No focal lesions. Right Kidney: Length: 11.0 cm. Normal renal cortical thickness and echogenicity without focal lesions or hydronephrosis. Left Kidney: Length: 12.2 cm. Normal renal cortical thickness and echogenicity without focal lesions or hydronephrosis. Abdominal aorta: No aneurysm visualized. Other findings: None. Portal vein velocities: Proximal 67.6 centimeter/second. Mid: 59.2 centimeter/second. Distal: 50.9 centimeter/second. Hepatopetal flow. Right portal vein: 48 centimeter/second.  Hepatopetal flow. Left portal vein 89 cm per second.  Hepatopetal flow. Hepatic veins: Right: 41.9 centimeter/second.  Hepatofugal flow. Middle: 41.2 cm  second.  Hepatofugal flow. Left: 62.2 centimeter/second.  Hepatofugal flow. Intrahepatic IVC: Patent Hepatic artery: 131.6 centimeter/second. Splenic vein: 19.5 centimeter/second. Splenic size: 12.4 x 5.3 x 4.2 cm.  Volume is 144 cubic cm. No portal vein occlusion or thrombus No splenic vein occlusion or thrombus Ascites is present. No obvious varices. IMPRESSION: 1. Advanced cirrhotic changes involving the liver but no focal hepatic lesion. 2. Status post cholecystectomy.  No biliary dilatation. 3. Normal size spleen. 4. Normal hepatopetal flow in the portal vein. No occlusion or thrombus. Electronically Signed   By: Rudie Meyer M.D.   On: 02/15/2017 21:17   US Abdomen Limited  Result Date: 02/24/2017 CLINICAL DATA:  Evaluate for paracentesis EXAM: LIMITED ABDOMEN ULTRASOUND FOR ASCITES TECHNIQUE: Limited ultrasound survey for ascites was performed in all four abdominal quadrants. COMPARISON:  None. FINDINGS: There is ascites at the level of the liver but only trace ascites throughout the remainder of the abdomen. Paracentesis was not performed. IMPRESSION: Although there is fluid about the liver, there is trace ascites elsewhere in the abdomen. Paracentesis was not performed. Electronically Signed   By: Jolaine Click M.D.   On: 02/24/2017 13:49   Dg C-arm 1-60 Min  Result Date: 02/18/2017 CLINICAL DATA:  Right intertrochanteric femur ORIF. EXAM: OPERATIVE RIGHT HIP (WITH PELVIS IF PERFORMED) TECHNIQUE: Fluoroscopic spot image(s) were submitted for interpretation post-operatively. Fluoroscopy time is 1 minute, 17  seconds. COMPARISON:  Right hip x-rays dated February 15, 2017. FINDINGS: Intraoperative x-rays demonstrate interval short cephalomedullary nail fixation of the right intertrochanteric femur fracture. Alignment is near anatomic. No evidence of hardware complication. IMPRESSION: Right intertrochanteric femur fracture ORIF in near anatomic alignment. Electronically Signed   By: Obie Dredge M.D.    On: 02/18/2017 15:44   US Liver Doppler  Result Date: 02/15/2017 CLINICAL DATA:  Liver failure. EXAM: ABDOMEN ULTRASOUND COMPLETE COMPARISON:  CT scan 02/04/2017 FINDINGS: Gallbladder: Surgically absent. Common bile duct: Diameter: 5.8 mm Liver: Cirrhotic changes involving the liver with confluent hepatic fibrosis and regenerating nodules. No obvious focal hepatic lesion. IVC: Normal caliber. Pancreas: Not visualized. Spleen: Normal size.  No focal lesions. Right Kidney: Length: 11.0 cm. Normal renal cortical thickness and echogenicity without focal lesions or hydronephrosis. Left Kidney: Length: 12.2 cm. Normal renal cortical thickness and echogenicity without focal lesions or hydronephrosis. Abdominal aorta: No aneurysm visualized. Other findings: None. Portal vein velocities: Proximal 67.6 centimeter/second. Mid: 59.2 centimeter/second. Distal: 50.9 centimeter/second. Hepatopetal flow. Right portal vein: 48 centimeter/second.  Hepatopetal flow. Left portal vein 89 cm per second.  Hepatopetal flow. Hepatic veins: Right: 41.9 centimeter/second.  Hepatofugal flow. Middle: 41.2 cm second.  Hepatofugal flow. Left: 62.2 centimeter/second.  Hepatofugal flow. Intrahepatic IVC: Patent Hepatic artery: 131.6 centimeter/second. Splenic vein: 19.5 centimeter/second. Splenic size: 12.4 x 5.3 x 4.2 cm.  Volume is 144 cubic cm. No portal vein occlusion or thrombus No splenic vein occlusion or thrombus Ascites is present. No obvious varices. IMPRESSION: 1. Advanced cirrhotic changes involving the liver but no focal hepatic lesion. 2. Status post cholecystectomy.  No biliary dilatation. 3. Normal size spleen. 4. Normal hepatopetal flow in the portal vein. No occlusion or thrombus. Electronically Signed   By: Rudie Meyer M.D.   On: 02/15/2017 21:17   Dg Hip Port Unilat With Pelvis 1v Right  Result Date: 02/18/2017 CLINICAL DATA:  Postoperative status post ORIF right proximal femur fracture EXAM: DG HIP (WITH OR WITHOUT  PELVIS) 1V PORT RIGHT COMPARISON:  02/15/2017 right hip radiograph FINDINGS: Comminuted intertrochanteric right proximal femur fracture is in near-anatomic alignment status post transfixation by intramedullary rod with interlocking right femoral neck pin and distal interlocking screw. No right hip dislocation. 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 No suspicious focal osseous lesions. Expected soft tissue emphysema surrounding surgical repair site in the right hip and thigh. IMPRESSION: Satisfactory immediate postoperative appearance status post ORIF intertrochanteric proximal right femur fracture, in near anatomic alignment. Electronically Signed   By: Debbie Phenix M.D.   On: 02/18/2017 18:03   Dg Hip Operative Unilat W Or W/o Pelvis Right  Result Date: 02/18/2017 CLINICAL DATA:  Right intertrochanteric femur ORIF. EXAM: OPERATIVE RIGHT HIP (WITH PELVIS IF PERFORMED) TECHNIQUE: Fluoroscopic spot image(s) were submitted for interpretation post-operatively. Fluoroscopy time is 1 minute, 17 seconds. COMPARISON:  Right hip x-rays dated February 15, 2017. FINDINGS: Intraoperative x-rays demonstrate interval short cephalomedullary nail fixation of the right intertrochanteric femur fracture. Alignment is near anatomic. No evidence of hardware complication. IMPRESSION: Right intertrochanteric femur fracture ORIF in near anatomic alignment. Electronically Signed   By: Obie Dredge M.D.   On: 02/18/2017 15:44   Dg Hip Unilat  With Pelvis 2-3 Views Right  Result Date: 02/15/2017 CLINICAL DATA:  Fall out of bed this morning landing on right hip. Right hip pain and deformity. EXAM: DG HIP (WITH OR WITHOUT PELVIS) 2-3V RIGHT COMPARISON:  None. FINDINGS: Displaced mildly comminuted intertrochanteric right hip fracture with mild proximal migration of  the femoral shaft. Mild displacement of the lesser trochanter. Minimal apex lateral angulation. Femoral head remains seated. No additional fracture of the bony pelvis.  IMPRESSION: Displaced mildly comminuted intertrochanteric right hip fracture. Electronically Signed   By: Rubye Oaks M.D.   On: 02/15/2017 04:12    Microbiology: Recent Results (from the past 240 hour(s))  Surgical pcr screen     Status: None   Collection Time: 02/17/17  5:41 AM  Result Value Ref Range Status   MRSA, PCR NEGATIVE NEGATIVE Final   Staphylococcus aureus NEGATIVE NEGATIVE Final    Comment: (NOTE) The Xpert SA Assay (FDA approved for NASAL specimens in patients 2 years of age and older), is one component of a comprehensive surveillance program. It is not intended to diagnose infection nor to guide or monitor treatment. Performed at [facility], [address] [place], [place] [address]      Labs: Basic Metabolic Panel: Recent Labs  Lab 02/20/17 0445 02/21/17 0444 02/23/17 0446 02/24/17 0422 02/25/17 0412  NA 134* 137 135 137 139  K 3.6 3.4* 3.3* 3.4* 3.1*  CL 107 109 107 108 112*  CO2 20* 20* 20* 18* 20*  GLUCOSE 105* 138* 128* 97 126*  BUN 20 20 19  23* 25*  CREATININE 1.44* 1.38* 1.38* 1.27* 1.28*  CALCIUM 7.4* 7.8* 8.1* 8.3* 8.4*  MG  --  1.5*  --   --   --    Liver Function Tests: Recent Labs  Lab 02/21/17 0444 02/23/17 0446 02/24/17 0422  AST 74* 84* 80*  ALT 94* 77* 68*  ALKPHOS 91 132* 174*  BILITOT 4.3* 5.9* 5.2*  PROT 5.5* 6.0* 6.0*  ALBUMIN 1.6* 1.6* 1.6*   No results for input(s): LIPASE, AMYLASE in the last 168 hours. Recent Labs  Lab 02/20/17 0445 02/22/17 0452 02/23/17 0446 02/24/17 0905 02/25/17 0412  AMMONIA 88* 72* 81* 56* 71*   CBC: Recent Labs  Lab 02/22/17 0500 02/24/17 0422 02/25/17 0412 02/25/17 1838 02/26/17 0337  WBC 21.0* 25.3* 21.5* 17.8* 16.8*  NEUTROABS  --   --  17.7*  --   --   HGB 7.2* 7.2* 6.2* 8.9* 8.7*  HCT 23.1* 23.1* 20.0* 27.4* 27.1*  MCV 93.5 93.1 93.5 88.4 89.1  PLT 112* 124* 105* 88* 70*    Principal Problem:   Acute upper GI bleeding Active Problems:    Alcoholic cirrhosis of liver with ascites (HCC)   Secondary esophageal varices with bleeding (HCC)   AKI (acute kidney injury) (HCC)   Alcohol abuse   Acute on chronic alcoholic liver disease (HCC)   Acute upper GI bleed   Thrombocytopenia (HCC)   Acute hepatitis C virus infection without hepatic coma   Acute blood loss anemia   Anemia of chronic disease   Benign essential HTN   Ascites due to alcoholic cirrhosis (HCC)   Pain   Acute GI bleeding   Closed displaced intertrochanteric fracture of right femur (HCC)   Coagulopathy (HCC)   Palliative care encounter   Comfort measures only status   Encounter for hospice care discussion   Time coordinating discharge: 52 MINUTES  Signed:  Brendia Sacks, MD [facility] 02/26/2017, 2:42 PM

## 2017-02-26 NOTE — Consult Note (Signed)
                                                                                 Consultation Note Date: 02/26/2017   Patient Name: Debbie Barker  DOB: 02/07/1970  MRN: [id number]  Age / Sex: 47 y.o., female  PCP: Placey, [name], NP Referring Physician: Goodrich, [name] P, MD  Reason for Consultation: Establishing goals of care  HPI/Patient Profile: 47 y.o. female  with past medical history of end stage liver disease, ETOH abuse, CKD 2, chronic pancreatitis, and psychiatric history who was admitted on 02/15/2017 with a fractured hip and GI bleeding.  She underwent banding and was transfused for units for a hgb of 3.2.  Her INR was 2.65 secondary to coagulopathy from advanced liver disease.  She underwent hemiarthroplasty.  The patient has remained encephalopathic despite treatment with lactulose and rifaximin.  She has continued to have a degree of bleeding and required another unit of RBCs to be transfused 2/24.    Clinical Assessment and Goals of Care:  I have reviewed medical records including EPIC notes, labs and imaging, received report from the care team, assessed the patient and then met at the bedside along with her brother [name] to discuss diagnosis prognosis, GOC, EOL wishes, disposition and options.  I introduced Palliative Medicine as specialized medical care for people living with serious illness. It focuses on providing relief from the symptoms and stress of a serious illness. The goal is to improve quality of life for both the patient and the family.  We discussed a brief life review of the patient. The was the only girl with 5 brothers.  She was particularly close to 1 brother who died approximately 20 years ago.  After her brother's death she began to have emotional difficulties.  She had two biological children who went to foster care when she was hospitalized.  By the time she was on her feet again the children had been adopted.  The loss of her children worsened her emotional  difficulties.  Her brother [name] has always been a fatherly protective figure in her life.    As far as functional and nutritional status the patient remains encephalopathic and in pain.  She is eating very little.  She was able to get to edge of bed with minimal assist but is too encephalopathic to stand safely even with assistance.  We discussed her current illness and what it means in the larger context of her on-going co-morbidities.  Natural disease trajectory and expectations at EOL were discussed.  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  Given her coagulopathy it is unlikely that her bleeding will be able to be completely controlled and she will continue to be encephalopathic.  She is suffering both physical and existential pain.   I explained to [name] that his sister is dying.  He stated he already understood that.  She has been talking to relatives long dead for the past week.    Hospice and Palliative Care services outpatient were explained and offered.  [name] prioritizes his sister's comfort and wants her cared [facility] at [facility] in [place] if a bed is available.   There   are several family members in [place].  Questions and concerns were addressed. The family was encouraged to call with questions or concerns.   Primary Decision Maker:  NEXT OF KIN Brother, [name]     SUMMARY OF RECOMMENDATIONS    Will schedule low dose roxicodone and ativan as the patient is very agitated and in pain. Will d/c all measures not related to comfort. Would D/C PICC line and insert peripheral IV.   D/C Mitts. Social work consult placed for Hospice House placement.  Code Status/Advance Care Planning:  DNR   Prognosis:  Days to possibly 1 week - given end stage liver disease, on-going encephalopathy and bleeding.    Discharge Planning: Hospice facility      Primary Diagnoses: Present on Admission: . Acute upper GI bleed   I have reviewed the medical record, interviewed  the patient and family, and examined the patient. The following aspects are pertinent.  Past Medical History:  Diagnosis Date  . Alcohol abuse 02/15/2017  . Alcoholic cirrhosis of liver with ascites (HCC) 02/15/2017  . Chronic pancreatitis (HCC)   . Hypertension   . Secondary esophageal varices with bleeding (HCC) 02/15/2017   Social History   Socioeconomic History  . Marital status: Single    Spouse name: None  . Number of children: None  . Years of education: None  . Highest education level: None  Social Needs  . Financial resource strain: None  . Food insecurity - worry: None  . Food insecurity - inability: None  . Transportation needs - medical: None  . Transportation needs - non-medical: None  Occupational History  . None  Tobacco Use  . Smoking status: Current Every Day Smoker  . Smokeless tobacco: Never Used  Substance and Sexual Activity  . Alcohol use: Yes  . Drug use: Yes    Types: Barbituates, Marijuana  . Sexual activity: Not Currently  Other Topics Concern  . None  Social History Narrative  . None   History reviewed. No pertinent family history. Scheduled Meds: . Chlorhexidine Gluconate Cloth  6 each Topical Q12H  . lactulose  30 g Oral Daily  . LORazepam  0.5 mg Intravenous BID  . mouth rinse  15 mL Mouth Rinse BID  . nystatin  5 mL Oral QID  . oxyCODONE  5 mg Oral Q6H  . pantoprazole  40 mg Oral BID  . sodium chloride flush  3 mL Intravenous Q12H  . sodium chloride flush  3 mL Intravenous Q12H   Continuous Infusions: . sodium chloride    . sodium chloride    . sodium chloride     PRN Meds:.sodium chloride, albuterol, antiseptic oral rinse, glycopyrrolate **OR** glycopyrrolate **OR** glycopyrrolate, haloperidol **OR** haloperidol **OR** haloperidol lactate, LORazepam, morphine injection, ondansetron **OR** ondansetron (ZOFRAN) IV, ondansetron **OR** ondansetron (ZOFRAN) IV, phenol, polyvinyl alcohol, sodium chloride flush, sodium chloride  flush Allergies  Allergen Reactions  . Penicillins     UNSPECIFIED REACTION   Has patient had a PCN reaction causing immediate rash, facial/tongue/throat swelling, SOB or lightheadedness with hypotension: Unknown Has patient had a PCN reaction causing severe rash involving mucus membranes or skin necrosis: Unknown Has patient had a PCN reaction that required hospitalization: Unknown Has patient had a PCN reaction occurring within the last 10 years: No If all of the above answers are "NO", then may proceed with Cephalosporin use.    Review of Systems patient complaining of pain all over.  She is unable to give me further details. Physical Exam    Well developed female, moving arms without purpose.  States she is seeing bees in the air CV rrr resp no distress Abdomen distended, firm, +hepatomegaly, +caput medusa Skin +telangectasias Lower extremities - not examined.  Vital Signs: BP (!) 108/53 (BP Location: Left Arm)   Pulse 81   Temp 97.9 F (36.6 C) (Oral)   Resp 12   Ht 5' 8" (1.727 m)   Wt 69.8 kg (153 lb 14.1 oz)   SpO2 95%   BMI 23.40 kg/m  Pain Assessment: PAINAD POSS *See Group Information*: 1-Acceptable,Awake and alert Pain Score: Asleep   SpO2: SpO2: 95 % O2 Device:SpO2: 95 % O2 Flow Rate: .O2 Flow Rate (L/min): 0 L/min  IO: Intake/output summary:   Intake/Output Summary (Last 24 hours) at 02/26/2017 0957 Last data filed at 02/26/2017 0500 Gross per 24 hour  Intake 1172 ml  Output -  Net 1172 ml    LBM: Last BM Date: 02/25/17 Baseline Weight: Weight: 72.6 kg (160 lb) Most recent weight: Weight: 69.8 kg (153 lb 14.1 oz)     Palliative Assessment/Data: 20%     Time In: 9:00 Time Out: 10:15 Time Total: 75 min Greater than 50%  of this time was spent counseling and coordinating care related to the above assessment and plan.  Signed by:  , PA-C Palliative Medicine Pager: [phone]  Please contact Palliative Medicine Team phone at  [phone] for questions and concerns.  For individual provider: See Amion               

## 2017-02-26 NOTE — Clinical Social Work Note (Signed)
Clinical Social Work Assessment  Patient Details  Name: Debbie Barker MRN: [id number] Date of Birth: June 21, 1970  Date of referral:  02/26/17               Reason for consult:  Facility Placement                Permission sought to share information with:  Facility Medical sales representativeContact Representative, Family Supports Permission granted to share information::  No  Name::     Debbie Barker  Agency::  Hospice  Relationship::  Brother  Contact Information:  [phone]  Housing/Transportation Living arrangements for the past 2 months:  Single Family Home Source of Information:  Siblings Patient Interpreter Needed:  None Criminal Activity/Legal Involvement Pertinent to Current Situation/Hospitalization:  No - Comment as needed Significant Relationships:  Other Family Members, Siblings Lives with:  Significant Other Do you feel safe going back to the place where you live?  No Need for family participation in patient care:  Yes (Comment)  Care giving concerns:  CSW received consult regarding residential hospice placement. Per patient's brother and young daughter, they would like patient to be comfortable.  CSW to continue to follow and assist with discharge planning needs.   Social Worker assessment / plan:  Patient to transition to Hospice facility.   Employment status:  Unemployed Health and safety inspectornsurance information:  Self Pay (Medicaid Pending) PT Recommendations:  Skilled Nursing Facility Information / Referral to community resources:     Patient/Family's Response to care:  Patient's family is grieving but grateful to have the time with the patient in a facility that is close to them.   Patient/Family's Understanding of and Emotional Response to Diagnosis, Current Treatment, and Prognosis: Patient's brother expressed understanding of CSW role and discharge process as well as medical condition. No questions/concerns about plan or treatment.    Emotional Assessment Appearance:  Appears older than stated  age Attitude/Demeanor/Rapport:  Unable to Assess Affect (typically observed):  Unable to Assess Orientation:  (Disoriented) Alcohol / Substance use:  Alcohol Use Psych involvement (Current and /or in the community):  No (Comment)  Discharge Needs  Concerns to be addressed:  Care Coordination Readmission within the last 30 days:  No Current discharge risk:  None Barriers to Discharge:  No Barriers Identified   Debbie Barker, LCSWA 02/26/2017, 2:14 PM

## 2017-02-26 NOTE — Progress Notes (Signed)
Debbie Barker 10:16 AM  Subjective: Discussed palliative care consult and hospice referral with family and patient has no obvious signs of bleeding and is a little more conversant today but still obviously agitated  Objective: Vital signs stable afebrile no acute distress exam essentially unchanged ascites unchanged hemoglobin okay status post transfusion  Assessment: Cirrhosis and recent hip fracturePlan:  please let us know if we could be of any further assistance and if patient improves we are happy to see back and repeat endoscopy when necessary  [facility] Surgery CenterMAGOD,Brigitta Pricer E  Pager [phone] After 5PM or if no answer call [phone]

## 2017-02-26 NOTE — Consult Note (Signed)
[facility]:  Met with pt's brother and next of kin-- Lorenda Hatchet. Discussed the hospice philosophy and the Hospice home agreement. He is in agreement with comfort care for the pt. I spoke to our Market researcher and she was approved. Pt's brother has discussed with both of the pt's children and all are in agreement with her transferring and focus on comfort. MD made aware of the bed available and [place] wanting to transfer. He is in agreement and will work on discharge. Webb Silversmith RN [phone]

## 2017-02-26 NOTE — Progress Notes (Signed)
Debbie Barker to be D/C'd Skilled nursing facility per MD order.  Discussed with the patient and all questions fully answered.  VSS, Skin clean, dry and intact without evidence of skin break down, no evidence of skin tears noted. IV catheter discontinued intact. Site without signs and symptoms of complications. Dressing and pressure applied.  An After Visit Summary was printed and given to the patient. Patient received prescription.  D/c education completed with patient/family including follow up instructions, medication list, d/c activities limitations if indicated, with other d/c instructions as indicated by MD - patient able to verbalize understanding, all questions fully answered.   Patient instructed to return to ED, call 911, or call MD for any changes in condition.   Patient escorted via WC, and D/C home via private auto.  Debbie Barker 02/26/2017 3:33 PM

## 2017-02-26 NOTE — Progress Notes (Signed)
Occupational Therapy Treatment Patient Details Name: Debbie Barker MRN: [id number] DOB: Oct 22, 1970 Today's Date: 02/26/2017    History of present illness Debbie Barker is a 47 y.o. Debbie with medical history significant of alcoholic cirrhosis complicated by chronic pancreatitis, esophageal varices with prior GI bleeds, ascites, active alcohol abuse, depression who comes in with fall and severe leg pain in the setting of presyncopal episode after several days of hematemesis. Pt underwent IM nail R femur (though procedure was delayed due to pt's low hgb). Pt recently d/c'ed from psych care in [facility].    OT comments  Pt restless with R hip pain. Assisted to EOB with +2 assist and drank from cup with straw. No interest in eating. Pt needing min guard assist for sitting. Unsafe to attempt transfer. Pt unaware of urinary incontinence. Total assist to change gown and remove wet bed pad. Palliative meeting this morning. Will continue to follow.  Follow Up Recommendations  SNF    Equipment Recommendations       Recommendations for Other Services      Precautions / Restrictions Precautions Precautions: Fall Restrictions RLE Weight Bearing: Weight bearing as tolerated       Mobility Bed Mobility Overal bed mobility: Needs Assistance Bed Mobility: Supine to Sit;Sit to Supine     Supine to sit: +2 for physical assistance;Min assist Sit to supine: Mod assist   General bed mobility comments: assist for LEs on/off bed, to raise trunk and position hips at EOB with bed pad, total assist +2 to pull up in bed  Transfers                 General transfer comment: unsafe to attempt    Balance Overall balance assessment: Needs assistance   Sitting balance-Leahy Scale: Fair Sitting balance - Comments: min guard assist                                   ADL either performed or assessed with clinical judgement   ADL Overall ADL's : Needs  assistance/impaired Eating/Feeding: Moderate assistance;Sitting Eating/Feeding Details (indicate cue type and reason): can drink, but unable to use utensils             Upper Body Dressing : Total assistance;Bed level   Lower Body Dressing: Total assistance;Bed level                 General ADL Comments: unsafe to transfer     Vision       Perception     Praxis      Cognition Arousal/Alertness: Awake/alert Behavior During Therapy: Flat affect;Restless Overall Cognitive Status: Impaired/Different from baseline Area of Impairment: Memory;Attention;Following commands;Safety/judgement;Problem solving                   Current Attention Level: Focused Memory: Decreased recall of precautions;Decreased short-term memory Following Commands: Follows one step commands with increased time Safety/Judgement: Decreased awareness of safety;Decreased awareness of deficits   Problem Solving: Slow processing;Decreased initiation;Requires verbal cues;Requires tactile cues;Difficulty sequencing General Comments: pt unaware of urinary incontinence with wet bed and gown        Exercises     Shoulder Instructions       General Comments      Pertinent Vitals/ Pain       Pain Assessment: Faces Faces Pain Scale: Hurts whole lot Pain Location: R hip Pain Descriptors / Indicators: Grimacing;Discomfort;Guarding Pain Intervention(s): Monitored during session;Repositioned;Patient requesting  pain meds-RN notified  Home Living                                          Prior Functioning/Environment              Frequency  Min 2X/week        Progress Toward Goals  OT Goals(current goals can now be found in the care plan section)  Progress towards OT goals: Not progressing toward goals - comment(restless, pain)  Acute Rehab OT Goals Patient Stated Goal: to drink OJ Time For Goal Achievement: 03/08/17 Potential to Achieve Goals: Fair  Plan  Discharge plan remains appropriate    Co-evaluation    PT/OT/SLP Co-Evaluation/Treatment: Yes Reason for Co-Treatment: For patient/therapist safety   OT goals addressed during session: ADL's and self-care      AM-PAC PT "6 Clicks" Daily Activity     Outcome Measure   Help from another person eating meals?: A Lot Help from another person taking care of personal grooming?: A Lot Help from another person toileting, which includes using toliet, bedpan, or urinal?: Total Help from another person bathing (including washing, rinsing, drying)?: Total Help from another person to put on and taking off regular upper body clothing?: Total Help from another person to put on and taking off regular lower body clothing?: Total 6 Click Score: 8    End of Session    OT Visit Diagnosis: Unsteadiness on feet (R26.81);Other abnormalities of gait and mobility (R26.89)   Activity Tolerance No increased pain(restlessness)   Patient Left in bed;with bed alarm set;with family/visitor present   Nurse Communication          Time: [id number] OT Time Calculation (min): 27 min  Charges: OT General Charges $OT Visit: 1 Visit OT Treatments $Self Care/Home Management : 8-22 mins  02/26/2017 Martie RoundJulie Korrina Zern, OTR/L Pager: [phone]  Iran PlanasMayberry, [place] BailiffJulie Lynn 02/26/2017, 9:08 AM

## 2017-02-26 NOTE — Progress Notes (Signed)
[facility] Anticipated DC date: 02/26/17 Family notified: Brother at Water quality scientistbedside Transport by: Sharin MonsPTAR   Per MD patient ready for [facility]. RN, patient, patient's family, and facility notified of DC. Discharge Summary sent to facility. RN given number for report [phone]). DC packet on chart. Ambulance transport requested for patient.   CSW signing off.  Cristobal GoldmannNadia Jashanti Clinkscale, ConnecticutLCSWA Clinical Social Worker [phone]

## 2017-02-26 NOTE — Consult Note (Signed)
WOC Nurse wound consult note Patient is being discharged to a Hospice House for end of life care today.  [facility] Everardo PacificVarkey [facility].  Thank you,  Helmut MusterSherry Vitali Seibert MSN,RN,CWOCN,CNS-BC, [phone])

## 2017-02-26 NOTE — Progress Notes (Signed)
CSW received consult regarding residential Hospice placement. CSW sent referral to [facility] for assessment.   Osborne Cascoadia Demone Lyles LCSW [phone]

## 2017-04-02 DEATH — deceased

## 2018-06-26 IMAGING — US US HEPATIC LIVER DOPPLER
1 series · 13 of 25 positions shown · non-contrast
Comparison: CT scan 02/04/2017

CLINICAL DATA: Liver failure.

EXAM:
ABDOMEN ULTRASOUND COMPLETE

[Series 1: us hepatic liver doppler · 0.26mm/px · 13 of 89 slices shown]
[im 1/89]
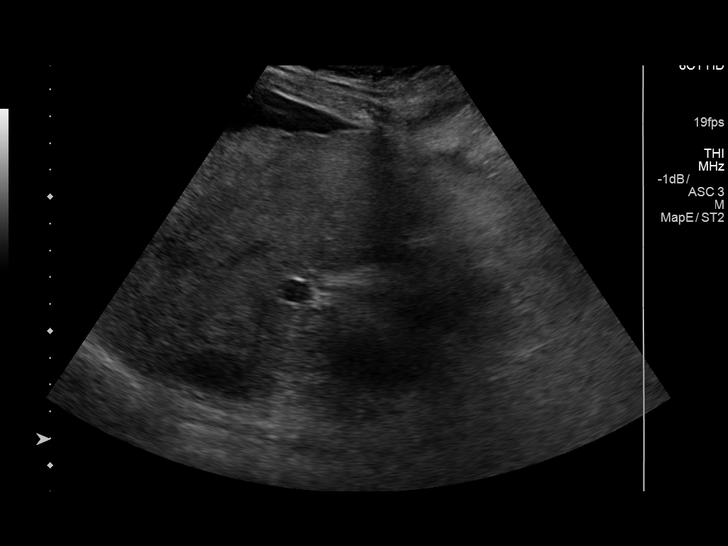
[im 8/89]
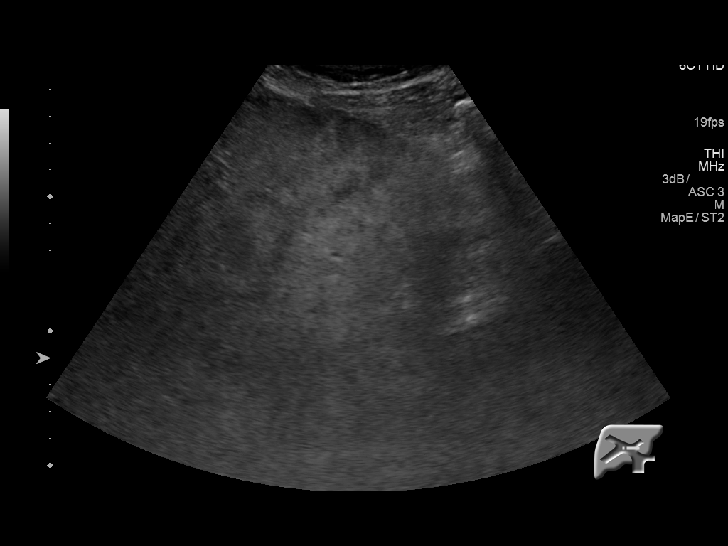
[im 15/89]
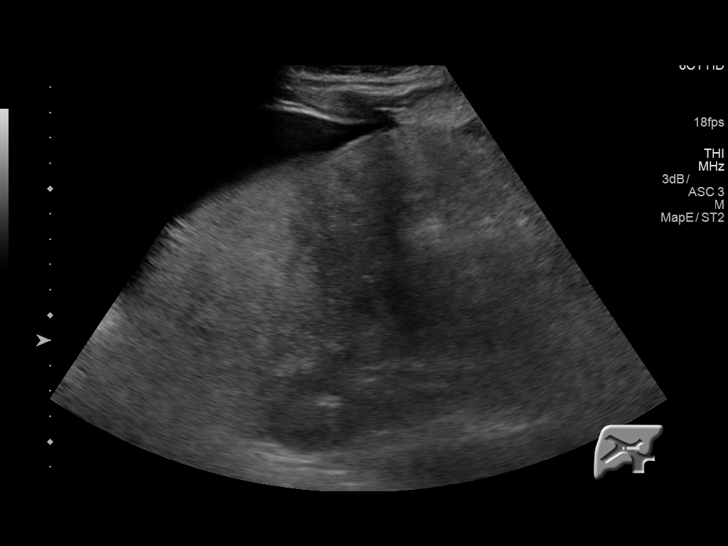
[im 23/89]
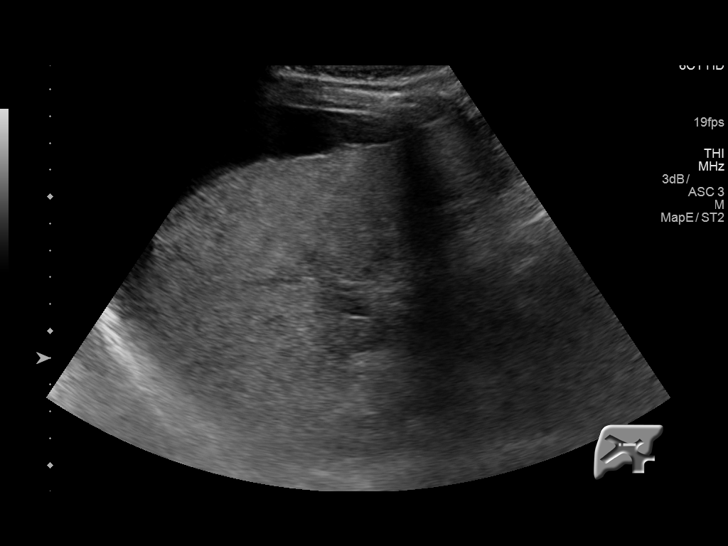
[im 30/89]
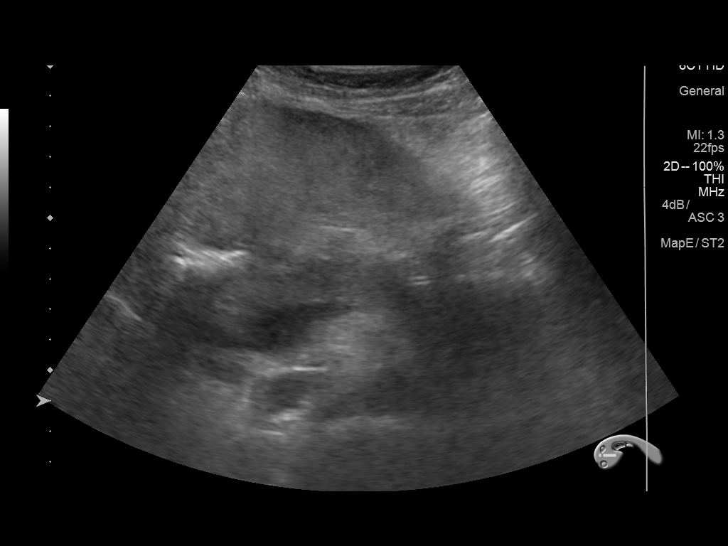
[im 37/89]
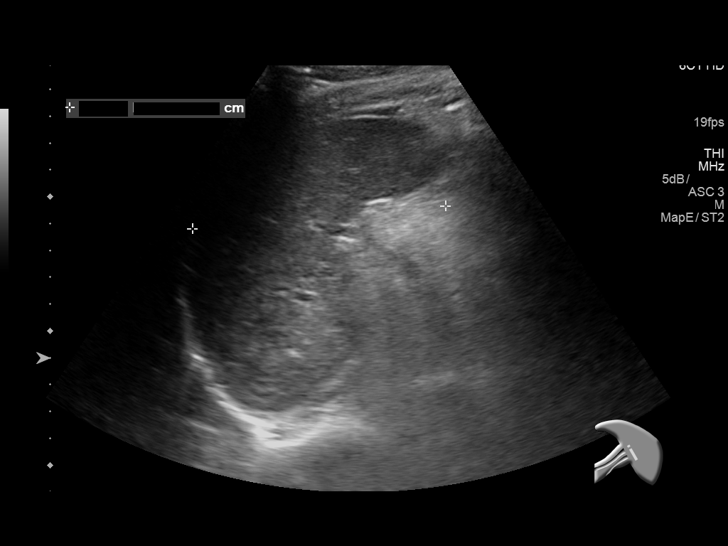
[im 45/89]
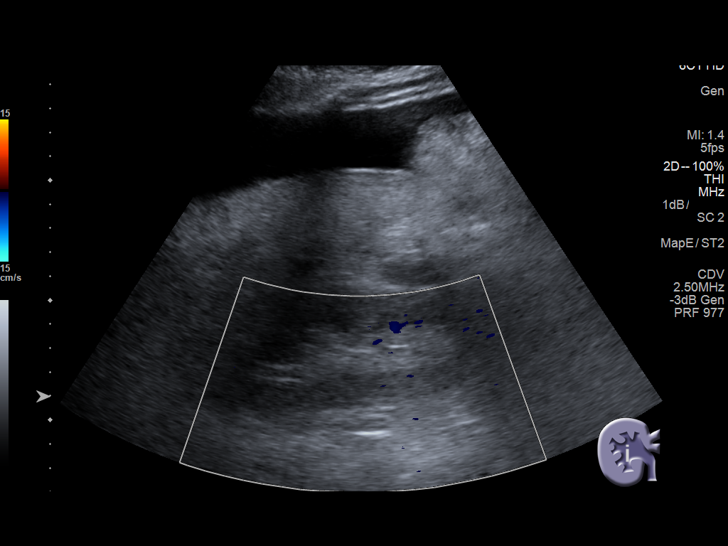
[im 52/89]
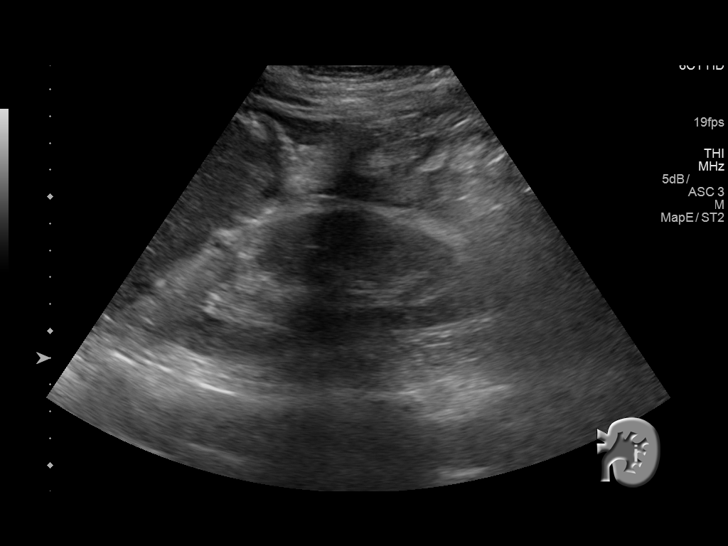
[im 59/89]
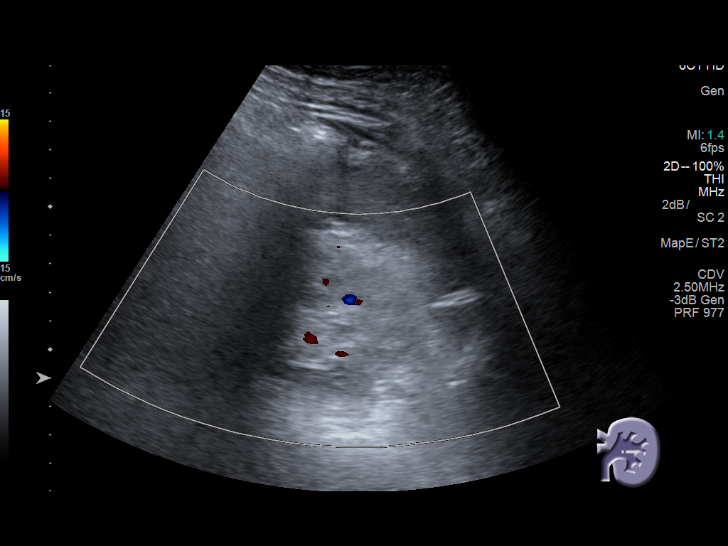
[im 67/89]
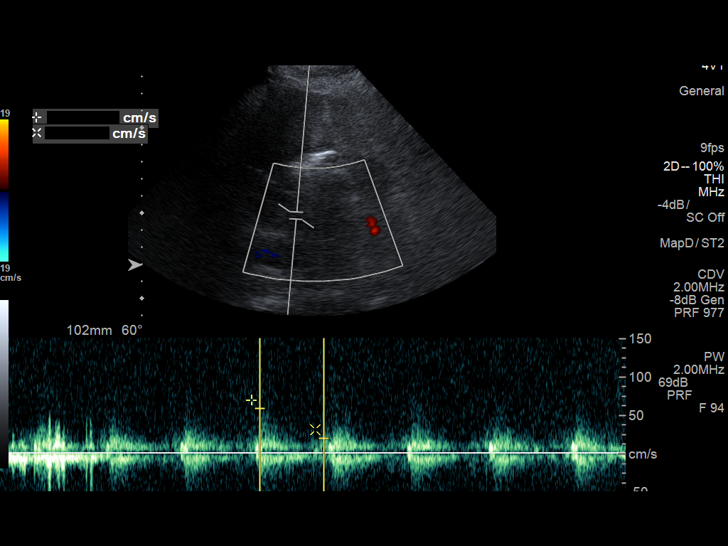
[im 74/89]
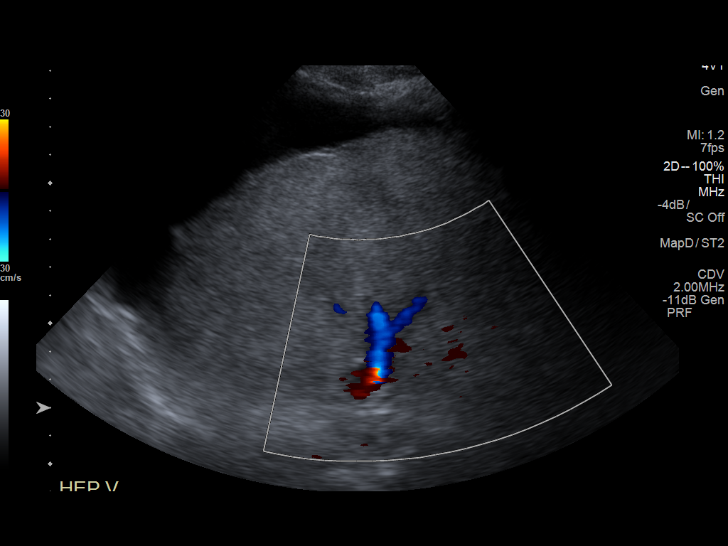
[im 81/89]
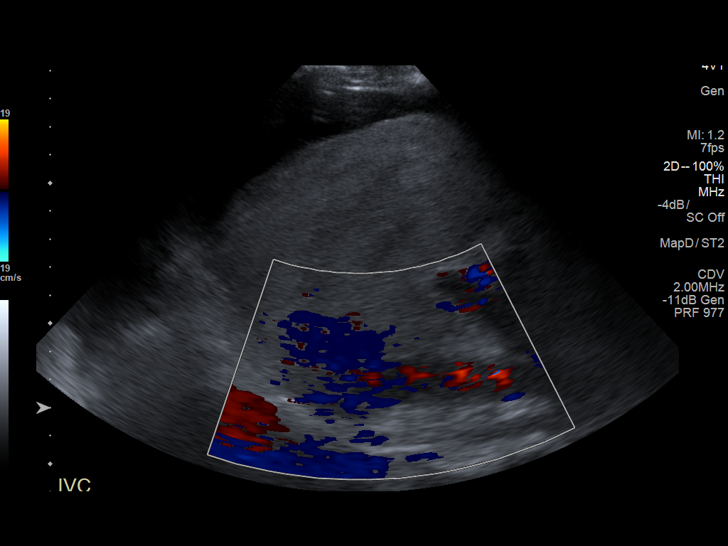
[im 89/89]
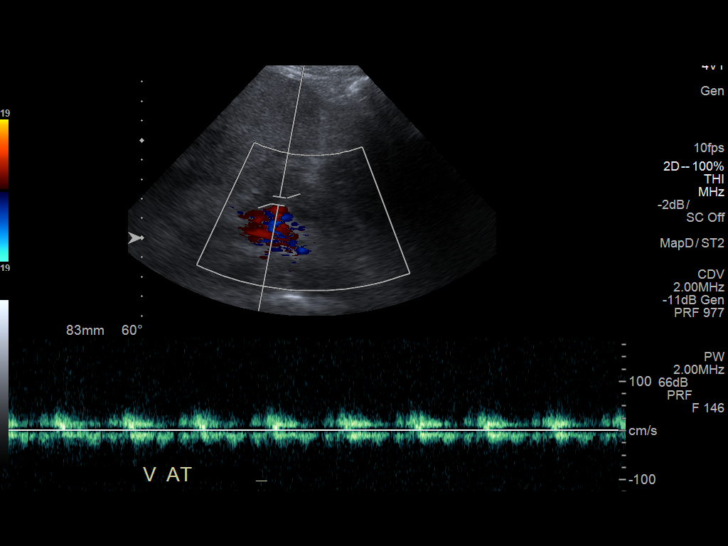

[13 of 25 positions shown; findings below may reference images not displayed]

FINDINGS: Gallbladder: Surgically absent.

Common bile duct: Diameter: 5.8 mm

Liver: Cirrhotic changes involving the liver with confluent hepatic
fibrosis and regenerating nodules. No obvious focal hepatic lesion.

IVC: Normal caliber.

Pancreas: Not visualized.

Spleen: Normal size.  No focal lesions.

Right Kidney: Length: 11.0 cm. Normal renal cortical thickness and
echogenicity without focal lesions or hydronephrosis.

Left Kidney: Length: 12.2 cm. Normal renal cortical thickness and
echogenicity without focal lesions or hydronephrosis.

Abdominal aorta: No aneurysm visualized.

Other findings: None.

Portal vein velocities: Proximal 67.6 centimeter/second. Mid:
centimeter/second. Distal: 50.9 centimeter/second. Hepatopetal flow.

Right portal vein: 48 centimeter/second.  Hepatopetal flow.

Left portal vein 89 cm per second.  Hepatopetal flow.

Hepatic veins: Right: 41.9 centimeter/second.  Hepatofugal flow.

Middle: 41.2 cm second.  Hepatofugal flow.

Left: 62.2 centimeter/second.  Hepatofugal flow.

Intrahepatic IVC: Patent

Hepatic artery: 131.6 centimeter/second.

Splenic vein: 19.5 centimeter/second.

Splenic size: 12.4 x 5.3 x 4.2 cm.  Volume is 144 cubic cm.

No portal vein occlusion or thrombus

No splenic vein occlusion or thrombus

Ascites is present.

No obvious varices.
IMPRESSION: 1. Advanced cirrhotic changes involving the liver but no focal
hepatic lesion.
2. Status post cholecystectomy.  No biliary dilatation.
3. Normal size spleen.
4. Normal hepatopetal flow in the portal vein. No occlusion or
thrombus.

## 2019-03-28 IMAGING — CR DG HIP (WITH OR WITHOUT PELVIS) 2-3V*R*
3 series · 3 of 3 positions shown · non-contrast
Comparison: None.

CLINICAL DATA: Fall out of bed this morning landing on right hip.
Right hip pain and deformity.

EXAM:
DG HIP (WITH OR WITHOUT PELVIS) 2-3V RIGHT

[w hip lat right]
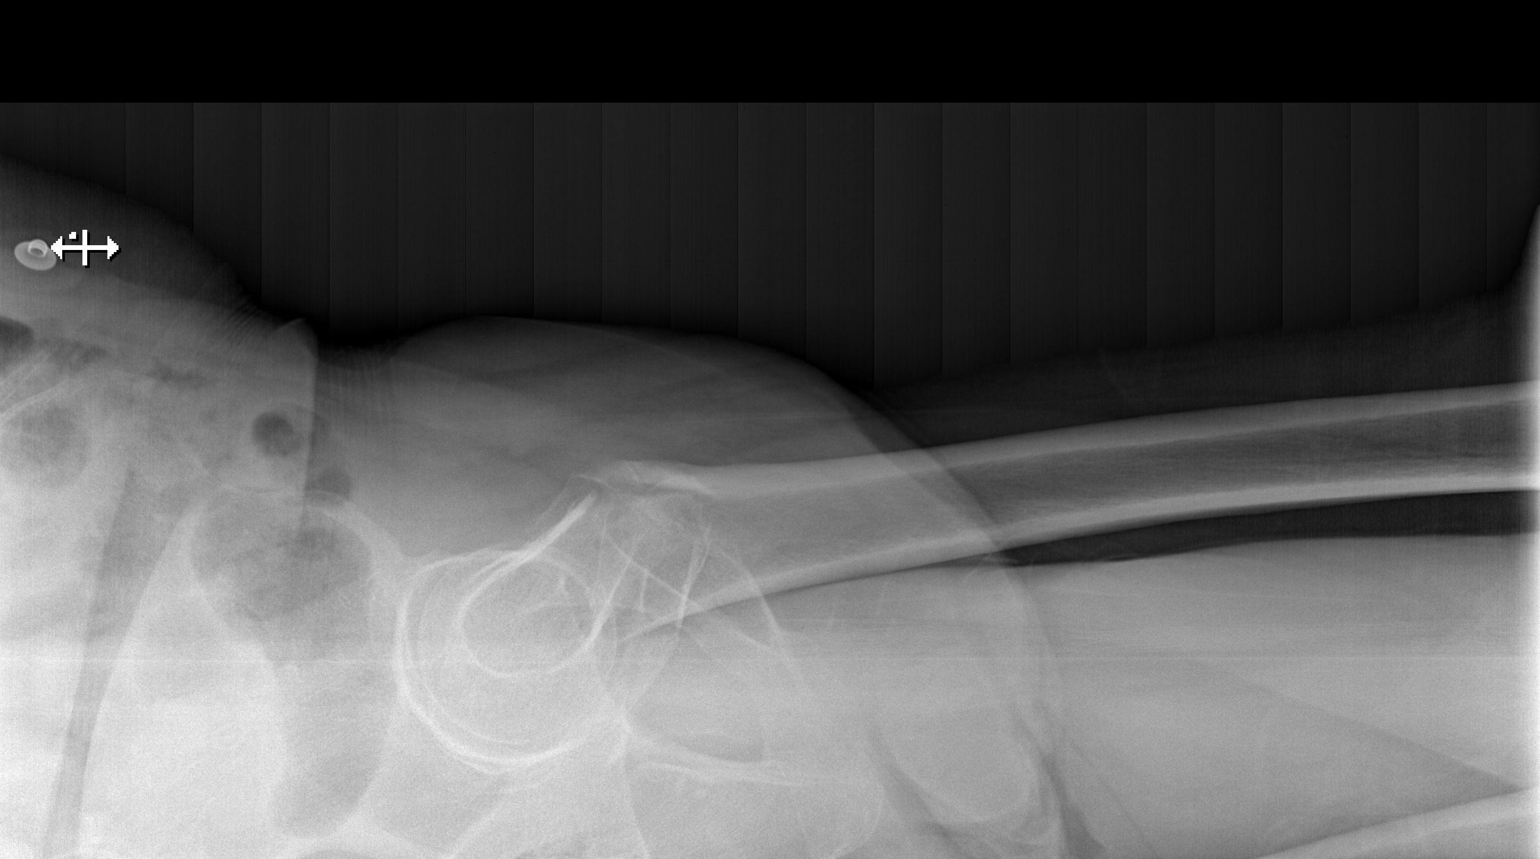

[x pelvis]
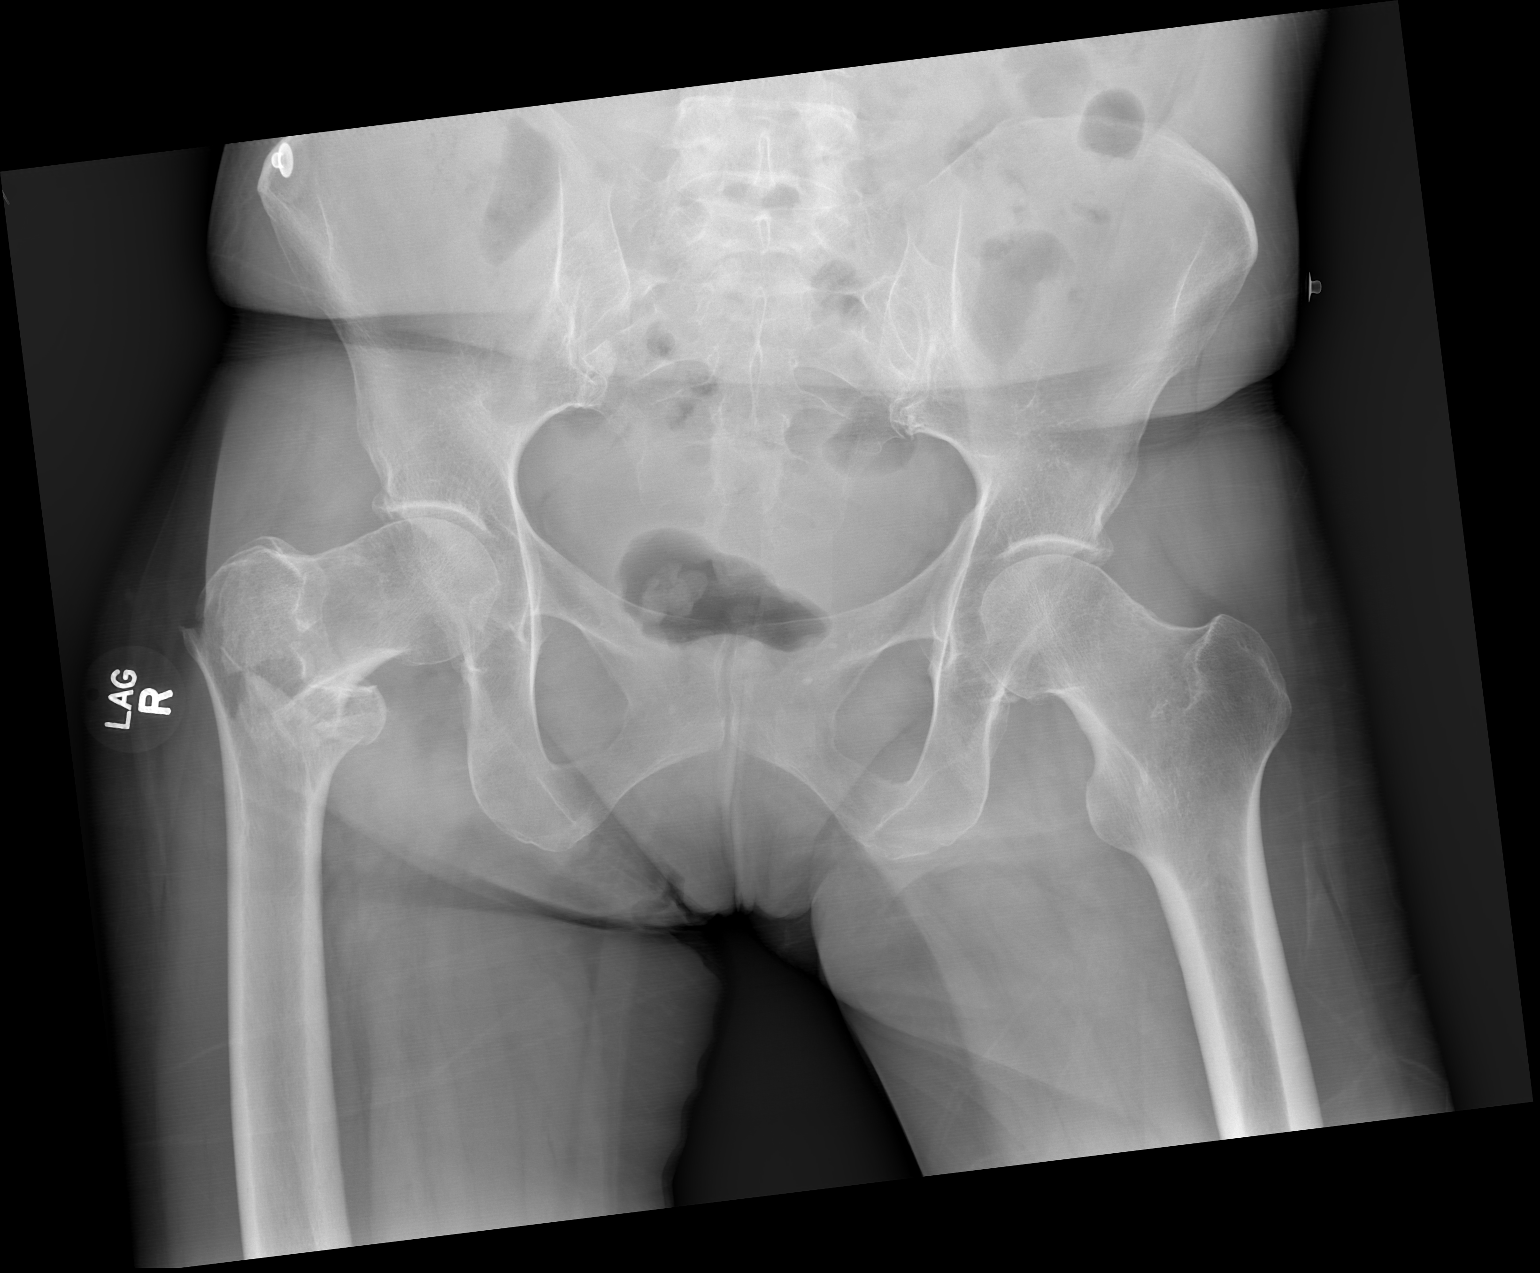

[x hip ap right]
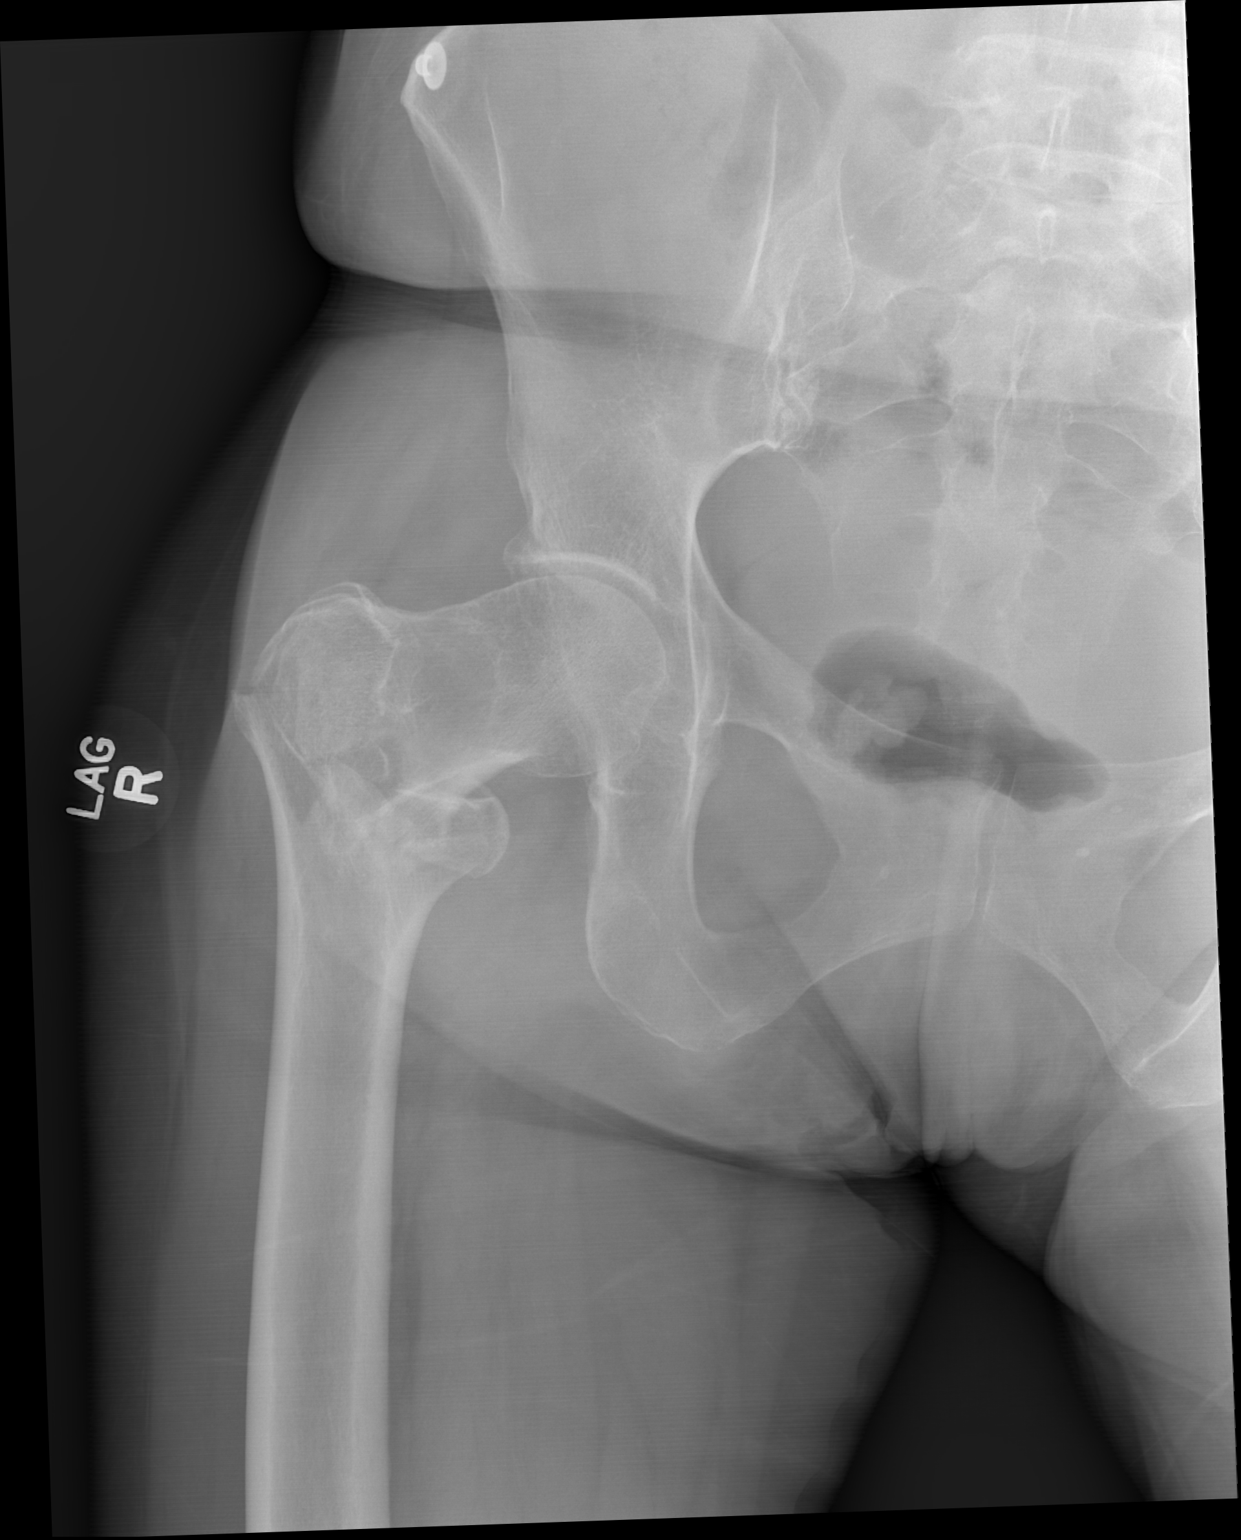

[3 of 3 positions shown; findings below may reference images not displayed]

FINDINGS: Displaced mildly comminuted intertrochanteric right hip fracture
with mild proximal migration of the femoral shaft. Mild displacement
of the lesser trochanter. Minimal apex lateral angulation. Femoral
head remains seated. No additional fracture of the bony pelvis.
IMPRESSION: Displaced mildly comminuted intertrochanteric right hip fracture.

## 2019-03-31 IMAGING — RF DG HIP (WITH PELVIS) OPERATIVE*R*
1 series · 4 of 4 positions shown · non-contrast
Comparison: Right hip x-rays dated February 15, 2017.

CLINICAL DATA: Right intertrochanteric femur ORIF.

EXAM:
OPERATIVE RIGHT HIP (WITH PELVIS IF PERFORMED)
TECHNIQUE: Fluoroscopic spot image(s) were submitted for interpretation
post-operatively.
Fluoroscopy time is 1 minute, 17 seconds.

[Series 1: run · 4 of 4 slices shown]
[im 1/4]
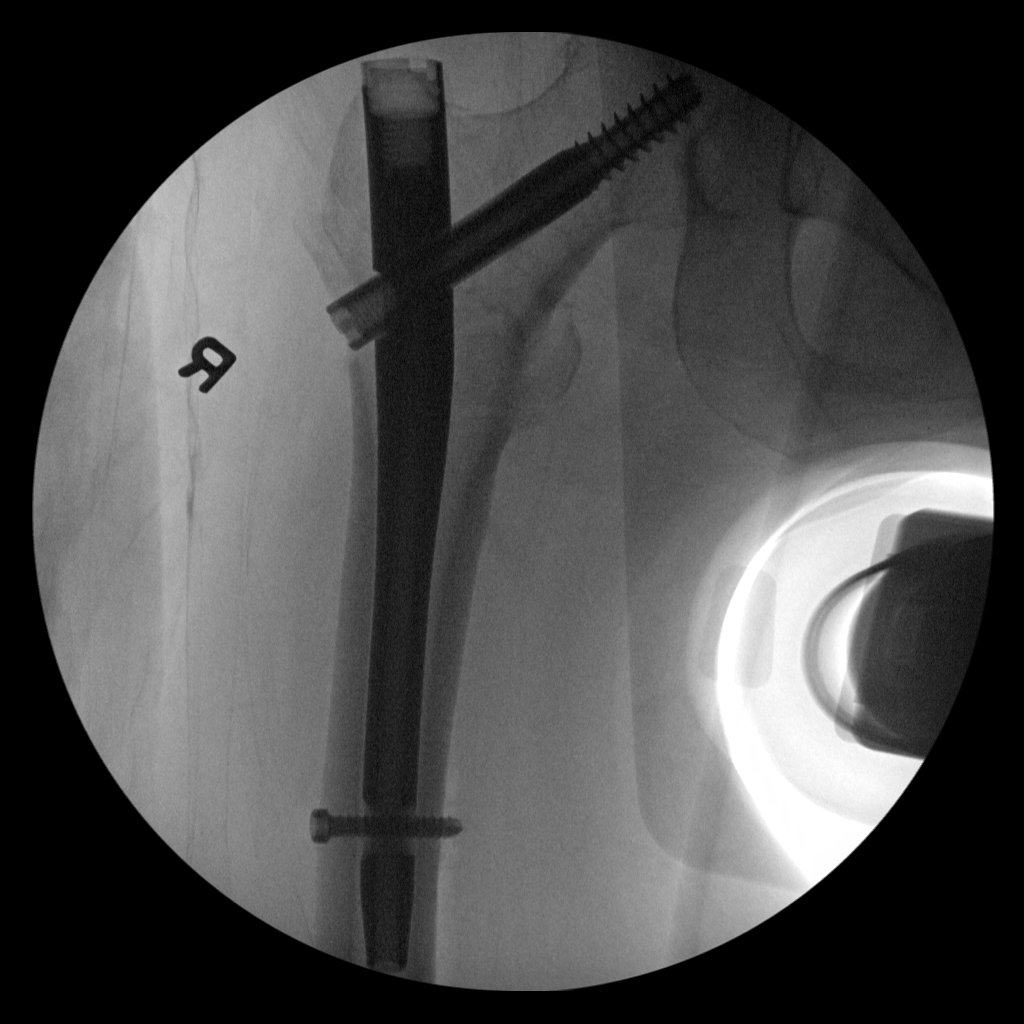
[im 2/4]
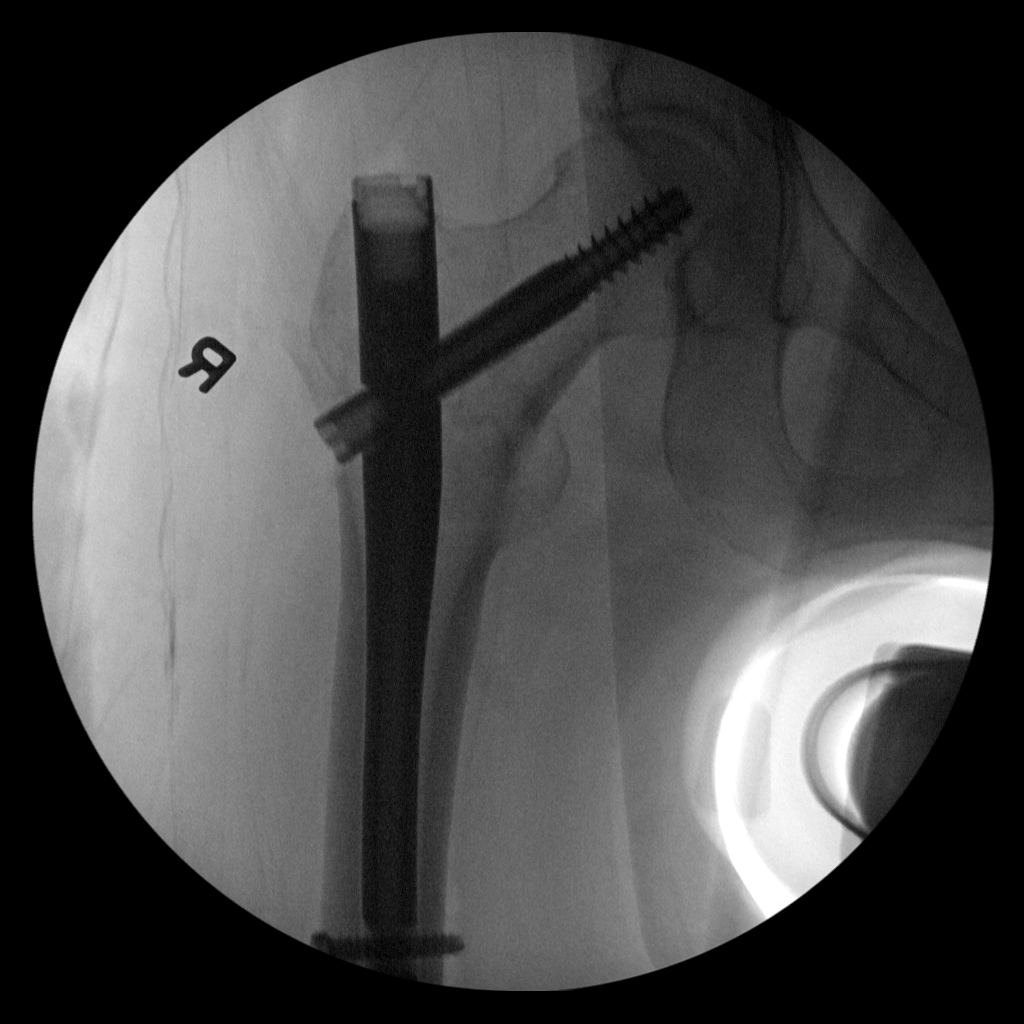
[im 3/4]
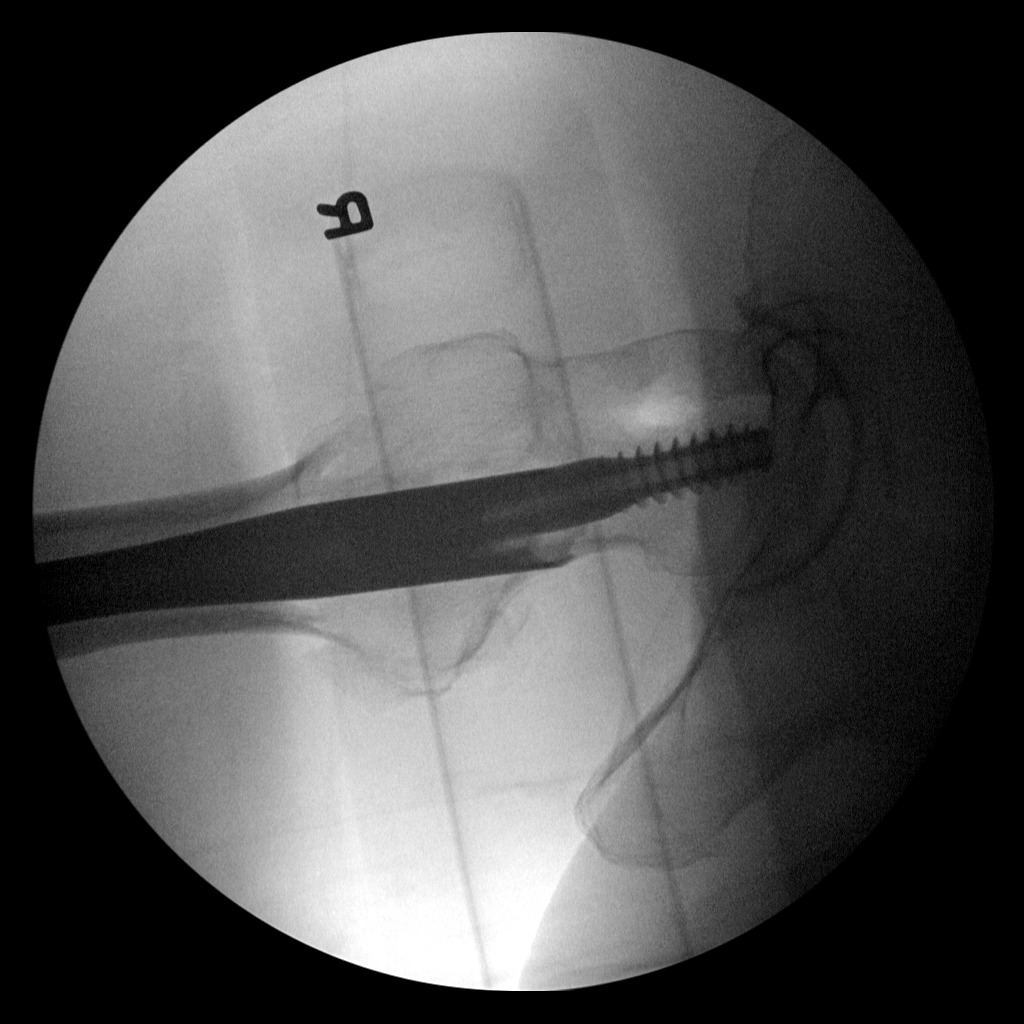
[im 4/4]
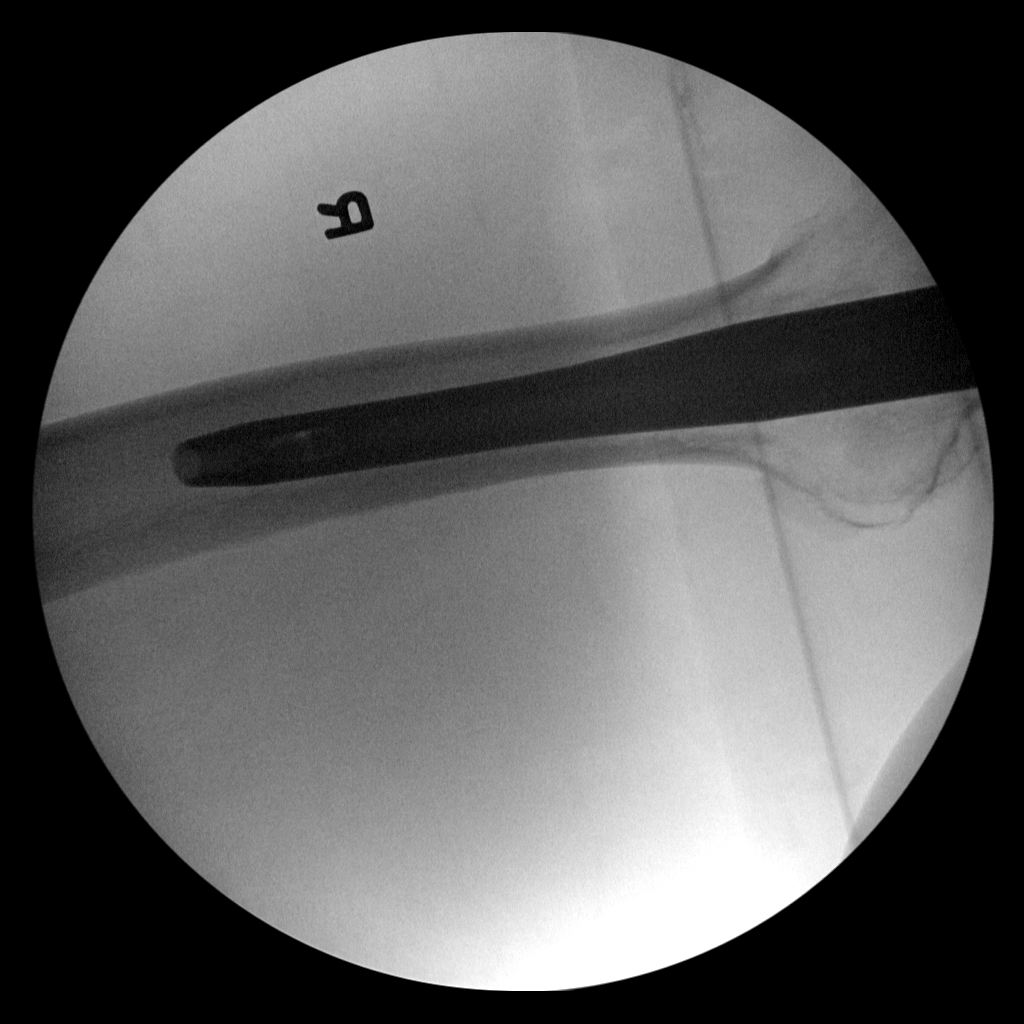

[4 of 4 positions shown; findings below may reference images not displayed]

FINDINGS: Intraoperative x-rays demonstrate interval short cephalomedullary
nail fixation of the right intertrochanteric femur fracture.
Alignment is near anatomic. No evidence of hardware complication.
IMPRESSION: Right intertrochanteric femur fracture ORIF in near anatomic
alignment.
